# Patient Record
Sex: Female | Born: 1980 | Race: Black or African American | Hispanic: No | Marital: Married | Smoking: Never smoker
Health system: Southern US, Community
[De-identification: ages and names within clinical notes are randomized; demographics above are authoritative.]

## PROBLEM LIST (undated history)

## (undated) ENCOUNTER — Inpatient Hospital Stay (HOSPITAL_COMMUNITY): Payer: Self-pay

## (undated) DIAGNOSIS — D509 Iron deficiency anemia, unspecified: Principal | ICD-10-CM

## (undated) DIAGNOSIS — T7840XA Allergy, unspecified, initial encounter: Secondary | ICD-10-CM

## (undated) DIAGNOSIS — M722 Plantar fascial fibromatosis: Secondary | ICD-10-CM

## (undated) DIAGNOSIS — D649 Anemia, unspecified: Secondary | ICD-10-CM

## (undated) DIAGNOSIS — O99012 Anemia complicating pregnancy, second trimester: Secondary | ICD-10-CM

## (undated) HISTORY — DX: Plantar fascial fibromatosis: M72.2

## (undated) HISTORY — DX: Anemia, unspecified: D64.9

## (undated) HISTORY — PX: MOUTH SURGERY: SHX715

## (undated) HISTORY — DX: Iron deficiency anemia, unspecified: D50.9

## (undated) HISTORY — DX: Anemia complicating pregnancy, second trimester: O99.012

## (undated) HISTORY — DX: Allergy, unspecified, initial encounter: T78.40XA

---

## 2004-08-22 HISTORY — PX: CYSTECTOMY: SUR359

## 2009-01-14 ENCOUNTER — Emergency Department (HOSPITAL_COMMUNITY): Admission: EM | Admit: 2009-01-14 | Discharge: 2009-01-14 | Payer: Self-pay | Admitting: Emergency Medicine

## 2011-09-07 ENCOUNTER — Other Ambulatory Visit: Payer: Self-pay | Admitting: Family Medicine

## 2011-09-07 DIAGNOSIS — N63 Unspecified lump in unspecified breast: Secondary | ICD-10-CM

## 2011-09-16 ENCOUNTER — Ambulatory Visit
Admission: RE | Admit: 2011-09-16 | Discharge: 2011-09-16 | Disposition: A | Discharge status: Home / Self Care | Payer: Managed Care, Other (non HMO) | Source: Ambulatory Visit | Attending: Family Medicine | Admitting: Family Medicine

## 2011-09-16 DIAGNOSIS — N63 Unspecified lump in unspecified breast: Secondary | ICD-10-CM

## 2012-02-01 ENCOUNTER — Other Ambulatory Visit: Payer: Self-pay | Admitting: Obstetrics

## 2012-08-22 NOTE — L&D Delivery Note (Signed)
Delivery Note At 9:36 AM a viable female was delivered via  (Presentation: LOA ;  ).  APGAR: 8-9, ; weight: 2935gms .   Placenta status: Spontaneous, intact , .  Cord: 3 vessel,  with the following complications: None.  Cord pH: none  Anesthesia:  Epidural Episiotomy: None Lacerations: None Suture Repair: None Est. Blood Loss (mL): 350  Mom to postpartum.  Baby to nursery-stable.  [name],[name] 03/07/2013, 9:59 AM

## 2012-09-04 LAB — OB RESULTS CONSOLE HGB/HCT, BLOOD
HCT: 31 %
Hemoglobin: 10 g/dL

## 2012-09-04 LAB — OB RESULTS CONSOLE GC/CHLAMYDIA
Chlamydia: NEGATIVE
Gonorrhea: NEGATIVE

## 2012-09-04 LAB — SICKLE CELL SCREEN: Sickle Cell Screen: POSITIVE

## 2012-09-04 LAB — OB RESULTS CONSOLE PLATELET COUNT: Platelets: 345 10*3/uL

## 2012-09-04 LAB — OB RESULTS CONSOLE ANTIBODY SCREEN: Antibody Screen: NEGATIVE

## 2012-10-30 LAB — US OB DETAIL + 14 WK

## 2012-11-09 ENCOUNTER — Encounter: Payer: Self-pay | Admitting: Obstetrics

## 2012-11-23 ENCOUNTER — Encounter: Payer: Self-pay | Admitting: *Deleted

## 2012-11-29 ENCOUNTER — Encounter: Payer: Self-pay | Admitting: Obstetrics

## 2012-12-11 ENCOUNTER — Encounter: Payer: Self-pay | Admitting: Obstetrics

## 2012-12-11 ENCOUNTER — Ambulatory Visit (INDEPENDENT_AMBULATORY_CARE_PROVIDER_SITE_OTHER): Payer: 59 | Admitting: Obstetrics

## 2012-12-11 VITALS — BP 107/70 | Temp 97.5°F | Wt 132.8 lb

## 2012-12-11 DIAGNOSIS — Z3480 Encounter for supervision of other normal pregnancy, unspecified trimester: Secondary | ICD-10-CM

## 2012-12-11 DIAGNOSIS — K219 Gastro-esophageal reflux disease without esophagitis: Secondary | ICD-10-CM

## 2012-12-11 DIAGNOSIS — O219 Vomiting of pregnancy, unspecified: Secondary | ICD-10-CM | POA: Insufficient documentation

## 2012-12-11 DIAGNOSIS — Z3482 Encounter for supervision of other normal pregnancy, second trimester: Secondary | ICD-10-CM

## 2012-12-11 LAB — POCT URINALYSIS DIPSTICK
Blood, UA: NEGATIVE
Nitrite, UA: NEGATIVE
Urobilinogen, UA: NEGATIVE
pH, UA: 5

## 2012-12-11 NOTE — Progress Notes (Signed)
Patient states she stopped prenatal vitals due to nausea, no appetite,constipation, and increase in heartburn not relieved by omeprazole. Patient states she started lactaid, and increase in green leafy vegetables to help with vitamin d level.

## 2012-12-20 ENCOUNTER — Other Ambulatory Visit: Payer: 59 | Admitting: *Deleted

## 2012-12-20 ENCOUNTER — Ambulatory Visit (INDEPENDENT_AMBULATORY_CARE_PROVIDER_SITE_OTHER): Payer: 59 | Admitting: Obstetrics

## 2012-12-20 ENCOUNTER — Encounter: Payer: Self-pay | Admitting: Obstetrics

## 2012-12-20 VITALS — BP 105/65 | Temp 97.2°F | Ht 69.0 in | Wt 133.0 lb

## 2012-12-20 DIAGNOSIS — Z3480 Encounter for supervision of other normal pregnancy, unspecified trimester: Secondary | ICD-10-CM

## 2012-12-20 DIAGNOSIS — Z3482 Encounter for supervision of other normal pregnancy, second trimester: Secondary | ICD-10-CM

## 2012-12-20 DIAGNOSIS — Z3483 Encounter for supervision of other normal pregnancy, third trimester: Secondary | ICD-10-CM

## 2012-12-20 LAB — POCT URINALYSIS DIPSTICK
Blood, UA: NEGATIVE
Protein, UA: NEGATIVE
Spec Grav, UA: <=1.005

## 2012-12-20 LAB — CBC
Hemoglobin: 7.8 g/dL — ABNORMAL LOW (ref 12.0–15.0)
MCH: 20.4 pg — ABNORMAL LOW (ref 26.0–34.0)
RBC: 3.82 MIL/uL — ABNORMAL LOW (ref 3.87–5.11)
WBC: 10.2 10*3/uL (ref 4.0–10.5)

## 2012-12-20 NOTE — Progress Notes (Signed)
Pulse-105 Pt c/o contractions and lower back pains worse with movement x 1 day intermittent with pink spotting in panties.

## 2012-12-20 NOTE — Addendum Note (Signed)
Addended by: Julaine Hua on: 12/20/2012 05:33 PM   Modules accepted: Orders

## 2012-12-21 LAB — HIV ANTIBODY (ROUTINE TESTING W REFLEX): HIV: NONREACTIVE

## 2012-12-21 LAB — GLUCOSE TOLERANCE, 2 HOURS W/ 1HR: Glucose, 1 hour: 116 mg/dL (ref 70–170)

## 2012-12-25 ENCOUNTER — Other Ambulatory Visit: Payer: Self-pay | Admitting: *Deleted

## 2012-12-25 DIAGNOSIS — D649 Anemia, unspecified: Secondary | ICD-10-CM

## 2012-12-25 MED ORDER — FUSION PLUS PO CAPS: 1.0000 | ORAL_CAPSULE | Freq: Every day | ORAL | Status: DC

## 2013-01-03 ENCOUNTER — Ambulatory Visit (INDEPENDENT_AMBULATORY_CARE_PROVIDER_SITE_OTHER): Payer: 59 | Admitting: Obstetrics & Gynecology

## 2013-01-03 ENCOUNTER — Encounter: Payer: Self-pay | Admitting: Obstetrics & Gynecology

## 2013-01-03 VITALS — BP 104/70 | Temp 97.1°F | Wt 135.0 lb

## 2013-01-03 DIAGNOSIS — Z3483 Encounter for supervision of other normal pregnancy, third trimester: Secondary | ICD-10-CM

## 2013-01-03 DIAGNOSIS — D649 Anemia, unspecified: Secondary | ICD-10-CM

## 2013-01-03 DIAGNOSIS — Z23 Encounter for immunization: Secondary | ICD-10-CM

## 2013-01-03 DIAGNOSIS — Z3480 Encounter for supervision of other normal pregnancy, unspecified trimester: Secondary | ICD-10-CM

## 2013-01-03 LAB — POCT URINALYSIS DIPSTICK
Glucose, UA: NEGATIVE
Leukocytes, UA: NEGATIVE
Nitrite, UA: NEGATIVE
Spec Grav, UA: 1.015
Urobilinogen, UA: NEGATIVE

## 2013-01-03 MED ORDER — FERRALET 90 90-1 MG PO TABS: 1.0000 | ORAL_TABLET | Freq: Every day | ORAL | Status: DC

## 2013-01-03 NOTE — Progress Notes (Signed)
Pulse-94 Pt reports iron supplement is causing nausea and heartburn. Pt reports has d/c x 2 days and symptoms are less.

## 2013-01-03 NOTE — Patient Instructions (Signed)
Laparoscopic Tubal Ligation Laparoscopic tubal ligation is a procedure that closes the fallopian tubes at a time other than right after childbirth. By closing the fallopian tubes, the eggs that are released from the ovaries cannot enter the uterus and sperm cannot reach the egg. Tubal ligation is also known as getting your "tubes tied." Tubal ligation is done so you will not be able to get pregnant or have a baby.  Although this procedure may be reversed, it should be considered permanent and irreversible. If you want to have future pregnancies, you should not have this procedure.  LET YOUR CAREGIVER KNOW ABOUT:  Allergies to food or medicine.  Medicines taken, including vitamins, herbs, eyedrops, over-the-counter medicines, and creams.  Use of steroids (by mouth or creams).  Previous problems with numbing medicines.  History of bleeding problems or blood clots.  Any recent colds or infections.  Previous surgery.  Other health problems, including diabetes and kidney problems.  Possibility of pregnancy, if this applies.  Any past pregnancies. RISKS AND COMPLICATIONS   Infection.  Bleeding.  Injury to surrounding organs.  Anesthetic side effects.  Failure of the procedure.  Ectopic pregnancy.  Future regret about having the procedure done. BEFORE THE PROCEDURE  Do not take aspirin or blood thinners a week before the procedure or as directed. This can cause bleeding.  Do not eat or drink anything 6 to 8 hours before the procedure. PROCEDURE   You may be given a medicine to help you relax (sedative) before the procedure. You will be given a medicine to make you sleep (general anesthetic) during the procedure.  A tube will be put down your throat to help your breath while under general anesthesia.  Two small cuts (incisions) are made in the lower abdominal area and near the belly button.  Your abdominal area will be inflated with a safe gas (carbon dioxide). This helps  give the surgeon room to operate, visualize, and helps the surgeon avoid other organs.  A thin, lighted tube (laparoscope) with a camera attached is inserted into your abdomen through one of the incisions near the belly button. Other small instruments are also inserted through the other abdominal incision.  The fallopian tubes are located and are either blocked with a ring, clip, or are burned (cauterized).  After the fallopian tubes are blocked, the gas is released from the abdomen.  The incisions will be closed with stitches (sutures), and a bandage may be placed over the incisions. AFTER THE PROCEDURE   You will rest in a recovery room for 1 4 hours until you are stable and doing well.  You will also have some mild abdominal discomfort for 3 7 days. You will be given pain medicine to ease any discomfort.  As long as there are no problems, you may be allowed to go home. Someone will need to drive you home and be with you for at least 24 hours once home.  You may have some mild discomfort in the throat. This is from the tube placed in your throat while you were sleeping.  You may experience discomfort in the shoulder area from some trapped air between the liver and diaphragm. This sensation is normal and will slowly go away on its own. Document Released: 11/14/2000 Document Revised: 02/07/2012 Document Reviewed: 11/19/2011 [employer] Patient Information 2013 [employer].  

## 2013-01-03 NOTE — Progress Notes (Signed)
Offered alternative Fe supplement.

## 2013-01-16 ENCOUNTER — Ambulatory Visit (INDEPENDENT_AMBULATORY_CARE_PROVIDER_SITE_OTHER): Payer: 59 | Admitting: Obstetrics

## 2013-01-16 ENCOUNTER — Encounter: Payer: Self-pay | Admitting: Obstetrics

## 2013-01-16 VITALS — BP 103/67 | Temp 97.7°F | Wt 134.6 lb

## 2013-01-16 DIAGNOSIS — Z3483 Encounter for supervision of other normal pregnancy, third trimester: Secondary | ICD-10-CM

## 2013-01-16 DIAGNOSIS — Z3480 Encounter for supervision of other normal pregnancy, unspecified trimester: Secondary | ICD-10-CM

## 2013-01-16 LAB — POCT URINALYSIS DIPSTICK
Ketones, UA: NEGATIVE
Leukocytes, UA: NEGATIVE
Nitrite, UA: NEGATIVE
Urobilinogen, UA: 4
pH, UA: 5

## 2013-01-16 NOTE — Progress Notes (Signed)
Pulse- 94 

## 2013-01-31 ENCOUNTER — Ambulatory Visit (INDEPENDENT_AMBULATORY_CARE_PROVIDER_SITE_OTHER): Payer: 59 | Admitting: Obstetrics

## 2013-01-31 VITALS — BP 104/66 | Temp 98.4°F | Wt 134.0 lb

## 2013-01-31 DIAGNOSIS — Z3480 Encounter for supervision of other normal pregnancy, unspecified trimester: Secondary | ICD-10-CM

## 2013-01-31 DIAGNOSIS — Z3483 Encounter for supervision of other normal pregnancy, third trimester: Secondary | ICD-10-CM

## 2013-01-31 LAB — POCT URINALYSIS DIPSTICK
Blood, UA: NEGATIVE
Leukocytes, UA: NEGATIVE
Nitrite, UA: NEGATIVE
Protein, UA: NEGATIVE
Urobilinogen, UA: NEGATIVE
pH, UA: 7

## 2013-01-31 NOTE — Progress Notes (Signed)
Pulse- 89 Pt states she is having pressure on her bladder and 3-4 contractions a day.

## 2013-02-14 ENCOUNTER — Encounter: Payer: 59 | Admitting: Obstetrics

## 2013-02-15 ENCOUNTER — Ambulatory Visit (INDEPENDENT_AMBULATORY_CARE_PROVIDER_SITE_OTHER): Payer: 59 | Admitting: Obstetrics

## 2013-02-15 VITALS — BP 105/69 | Temp 98.4°F | Wt 132.2 lb

## 2013-02-15 DIAGNOSIS — Z3480 Encounter for supervision of other normal pregnancy, unspecified trimester: Secondary | ICD-10-CM

## 2013-02-15 DIAGNOSIS — Z3483 Encounter for supervision of other normal pregnancy, third trimester: Secondary | ICD-10-CM

## 2013-02-15 LAB — POCT URINALYSIS DIPSTICK
Blood, UA: NEGATIVE
Nitrite, UA: NEGATIVE
Spec Grav, UA: 1.015
Urobilinogen, UA: NEGATIVE
pH, UA: 7

## 2013-02-15 NOTE — Addendum Note (Signed)
Addended by: Julaine Hua on: 02/15/2013 12:41 PM   Modules accepted: Orders

## 2013-02-15 NOTE — Progress Notes (Signed)
Pulse: 97

## 2013-02-25 ENCOUNTER — Encounter: Payer: Self-pay | Admitting: Obstetrics

## 2013-02-25 ENCOUNTER — Ambulatory Visit (INDEPENDENT_AMBULATORY_CARE_PROVIDER_SITE_OTHER): Payer: 59 | Admitting: Obstetrics

## 2013-02-25 VITALS — BP 118/71 | Temp 97.7°F | Wt 135.0 lb

## 2013-02-25 DIAGNOSIS — Z3483 Encounter for supervision of other normal pregnancy, third trimester: Secondary | ICD-10-CM

## 2013-02-25 DIAGNOSIS — Z3480 Encounter for supervision of other normal pregnancy, unspecified trimester: Secondary | ICD-10-CM

## 2013-02-25 LAB — POCT URINALYSIS DIPSTICK
Bilirubin, UA: NEGATIVE
Ketones, UA: NEGATIVE
Leukocytes, UA: NEGATIVE
Protein, UA: NEGATIVE
Spec Grav, UA: 1.025
pH, UA: 5

## 2013-02-25 NOTE — Progress Notes (Signed)
Pulse-97 Pt c/o increased vaginal discharge and hearing a "plop" sound when voiding this morning but did not see anything in the toilet.

## 2013-03-05 ENCOUNTER — Ambulatory Visit (INDEPENDENT_AMBULATORY_CARE_PROVIDER_SITE_OTHER): Payer: 59 | Admitting: Obstetrics

## 2013-03-05 ENCOUNTER — Encounter: Payer: Self-pay | Admitting: Obstetrics

## 2013-03-05 VITALS — BP 115/67 | Temp 97.2°F | Wt 138.0 lb

## 2013-03-05 DIAGNOSIS — Z3480 Encounter for supervision of other normal pregnancy, unspecified trimester: Secondary | ICD-10-CM

## 2013-03-05 DIAGNOSIS — Z3483 Encounter for supervision of other normal pregnancy, third trimester: Secondary | ICD-10-CM

## 2013-03-05 LAB — POCT URINALYSIS DIPSTICK
Bilirubin, UA: NEGATIVE
Glucose, UA: NEGATIVE
Ketones, UA: NEGATIVE
Leukocytes, UA: NEGATIVE
Nitrite, UA: NEGATIVE
pH, UA: 5

## 2013-03-05 NOTE — Progress Notes (Signed)
Pulse-94 Pt c/o "bubbling" feeling in lower abdomen x 2 to 3 days.

## 2013-03-07 ENCOUNTER — Inpatient Hospital Stay (HOSPITAL_COMMUNITY): Payer: 59 | Admitting: Anesthesiology

## 2013-03-07 ENCOUNTER — Encounter (HOSPITAL_COMMUNITY): Payer: Self-pay

## 2013-03-07 ENCOUNTER — Inpatient Hospital Stay (HOSPITAL_COMMUNITY)
Admission: AD | Admit: 2013-03-07 | Discharge: 2013-03-07 | Disposition: A | Discharge status: Home / Self Care | Payer: 59 | Source: Ambulatory Visit | Attending: Obstetrics & Gynecology | Admitting: Obstetrics & Gynecology

## 2013-03-07 ENCOUNTER — Encounter (HOSPITAL_COMMUNITY): Payer: Self-pay | Admitting: Anesthesiology

## 2013-03-07 ENCOUNTER — Inpatient Hospital Stay (HOSPITAL_COMMUNITY)
Admission: AD | Admit: 2013-03-07 | Discharge: 2013-03-09 | DRG: 775 | Disposition: A | Discharge status: Home / Self Care | Payer: 59 | Source: Ambulatory Visit | Attending: Obstetrics | Admitting: Obstetrics

## 2013-03-07 ENCOUNTER — Encounter (HOSPITAL_COMMUNITY): Payer: Self-pay | Admitting: *Deleted

## 2013-03-07 DIAGNOSIS — O9902 Anemia complicating childbirth: Secondary | ICD-10-CM | POA: Diagnosis present

## 2013-03-07 DIAGNOSIS — O479 False labor, unspecified: Secondary | ICD-10-CM | POA: Insufficient documentation

## 2013-03-07 DIAGNOSIS — D649 Anemia, unspecified: Secondary | ICD-10-CM | POA: Diagnosis present

## 2013-03-07 DIAGNOSIS — O99892 Other specified diseases and conditions complicating childbirth: Principal | ICD-10-CM | POA: Diagnosis present

## 2013-03-07 DIAGNOSIS — Z22330 Carrier of Group B streptococcus: Secondary | ICD-10-CM

## 2013-03-07 LAB — CBC
Hemoglobin: 9 g/dL — ABNORMAL LOW (ref 12.0–15.0)
MCH: 19.4 pg — ABNORMAL LOW (ref 26.0–34.0)
MCV: 64 fL — ABNORMAL LOW (ref 78.0–100.0)
RBC: 4.64 MIL/uL (ref 3.87–5.11)

## 2013-03-07 LAB — RPR: RPR Ser Ql: NONREACTIVE

## 2013-03-07 MED ORDER — FENTANYL 2.5 MCG/ML BUPIVACAINE 1/10 % EPIDURAL INFUSION (WH - ANES)
14.0000 mL/h | INTRAMUSCULAR | Status: DC | PRN
  Administered 2013-03-07: 14 mL/h via EPIDURAL
  Filled 2013-03-07: qty 125

## 2013-03-07 MED ORDER — LANOLIN HYDROUS EX OINT: TOPICAL_OINTMENT | CUTANEOUS | Status: DC | PRN

## 2013-03-07 MED ORDER — NALBUPHINE SYRINGE 5 MG/0.5 ML
10.0000 mg | INJECTION | INTRAMUSCULAR | Status: DC | PRN
  Filled 2013-03-07: qty 1

## 2013-03-07 MED ORDER — ACETAMINOPHEN 325 MG PO TABS: 650.0000 mg | ORAL_TABLET | ORAL | Status: DC | PRN

## 2013-03-07 MED ORDER — OXYCODONE-ACETAMINOPHEN 5-325 MG PO TABS: 1.0000 | ORAL_TABLET | ORAL | Status: DC | PRN

## 2013-03-07 MED ORDER — LACTATED RINGERS IV SOLN
500.0000 mL | Freq: Once | INTRAVENOUS | Status: AC
  Administered 2013-03-07: 500 mL via INTRAVENOUS

## 2013-03-07 MED ORDER — OXYTOCIN 40 UNITS IN LACTATED RINGERS INFUSION - SIMPLE MED: 62.5000 mL/h | INTRAVENOUS | Status: DC | PRN

## 2013-03-07 MED ORDER — LACTATED RINGERS IV SOLN: INTRAVENOUS | Status: DC

## 2013-03-07 MED ORDER — CITRIC ACID-SODIUM CITRATE 334-500 MG/5ML PO SOLN: 30.0000 mL | ORAL | Status: DC | PRN

## 2013-03-07 MED ORDER — DIPHENHYDRAMINE HCL 50 MG/ML IJ SOLN: 12.5000 mg | INTRAMUSCULAR | Status: DC | PRN

## 2013-03-07 MED ORDER — SENNOSIDES-DOCUSATE SODIUM 8.6-50 MG PO TABS
2.0000 | ORAL_TABLET | Freq: Every day | ORAL | Status: DC
  Administered 2013-03-08: 2 via ORAL

## 2013-03-07 MED ORDER — PHENYLEPHRINE 40 MCG/ML (10ML) SYRINGE FOR IV PUSH (FOR BLOOD PRESSURE SUPPORT)
80.0000 ug | PREFILLED_SYRINGE | INTRAVENOUS | Status: DC | PRN
  Filled 2013-03-07: qty 2
  Filled 2013-03-07: qty 5

## 2013-03-07 MED ORDER — EPHEDRINE 5 MG/ML INJ
10.0000 mg | INTRAVENOUS | Status: DC | PRN
  Filled 2013-03-07: qty 4
  Filled 2013-03-07: qty 2

## 2013-03-07 MED ORDER — PRENATAL MULTIVITAMIN CH
1.0000 | ORAL_TABLET | Freq: Every day | ORAL | Status: DC
  Administered 2013-03-07 – 2013-03-08 (×2): 1 via ORAL
  Filled 2013-03-07 (×2): qty 1

## 2013-03-07 MED ORDER — OXYTOCIN BOLUS FROM INFUSION
500.0000 mL | INTRAVENOUS | Status: DC
  Administered 2013-03-07: 500 mL via INTRAVENOUS

## 2013-03-07 MED ORDER — OXYCODONE-ACETAMINOPHEN 5-325 MG PO TABS
1.0000 | ORAL_TABLET | ORAL | Status: DC | PRN
  Administered 2013-03-07 – 2013-03-09 (×7): 1 via ORAL
  Filled 2013-03-07 (×7): qty 1

## 2013-03-07 MED ORDER — IBUPROFEN 600 MG PO TABS: 600.0000 mg | ORAL_TABLET | Freq: Four times a day (QID) | ORAL | Status: DC | PRN

## 2013-03-07 MED ORDER — SIMETHICONE 80 MG PO CHEW: 80.0000 mg | CHEWABLE_TABLET | ORAL | Status: DC | PRN

## 2013-03-07 MED ORDER — DIBUCAINE 1 % RE OINT: 1.0000 "application " | TOPICAL_OINTMENT | RECTAL | Status: DC | PRN

## 2013-03-07 MED ORDER — ONDANSETRON HCL 4 MG/2ML IJ SOLN: 4.0000 mg | Freq: Four times a day (QID) | INTRAMUSCULAR | Status: DC | PRN

## 2013-03-07 MED ORDER — ONDANSETRON HCL 4 MG PO TABS: 4.0000 mg | ORAL_TABLET | ORAL | Status: DC | PRN

## 2013-03-07 MED ORDER — ZOLPIDEM TARTRATE 5 MG PO TABS: 5.0000 mg | ORAL_TABLET | Freq: Every evening | ORAL | Status: DC | PRN

## 2013-03-07 MED ORDER — TETANUS-DIPHTH-ACELL PERTUSSIS 5-2.5-18.5 LF-MCG/0.5 IM SUSP: 0.5000 mL | Freq: Once | INTRAMUSCULAR | Status: DC

## 2013-03-07 MED ORDER — BENZOCAINE-MENTHOL 20-0.5 % EX AERO
1.0000 "application " | INHALATION_SPRAY | CUTANEOUS | Status: DC | PRN
  Administered 2013-03-07: 1 via TOPICAL
  Filled 2013-03-07: qty 56

## 2013-03-07 MED ORDER — LACTATED RINGERS IV SOLN: 500.0000 mL | INTRAVENOUS | Status: DC | PRN

## 2013-03-07 MED ORDER — NALBUPHINE HCL 10 MG/ML IJ SOLN
10.0000 mg | Freq: Four times a day (QID) | INTRAMUSCULAR | Status: DC | PRN
  Filled 2013-03-07: qty 1

## 2013-03-07 MED ORDER — SODIUM CHLORIDE 0.9 % IV SOLN
2.0000 g | Freq: Four times a day (QID) | INTRAVENOUS | Status: DC
  Administered 2013-03-07: 2 g via INTRAVENOUS
  Filled 2013-03-07 (×2): qty 2000

## 2013-03-07 MED ORDER — WITCH HAZEL-GLYCERIN EX PADS: 1.0000 "application " | MEDICATED_PAD | CUTANEOUS | Status: DC | PRN

## 2013-03-07 MED ORDER — EPHEDRINE 5 MG/ML INJ
10.0000 mg | INTRAVENOUS | Status: DC | PRN
  Filled 2013-03-07: qty 2

## 2013-03-07 MED ORDER — ONDANSETRON HCL 4 MG/2ML IJ SOLN: 4.0000 mg | INTRAMUSCULAR | Status: DC | PRN

## 2013-03-07 MED ORDER — PHENYLEPHRINE 40 MCG/ML (10ML) SYRINGE FOR IV PUSH (FOR BLOOD PRESSURE SUPPORT)
80.0000 ug | PREFILLED_SYRINGE | INTRAVENOUS | Status: DC | PRN
  Filled 2013-03-07: qty 2

## 2013-03-07 MED ORDER — PROMETHAZINE HCL 25 MG/ML IJ SOLN
25.0000 mg | Freq: Four times a day (QID) | INTRAMUSCULAR | Status: DC | PRN
Start: 2013-03-07 — End: 2013-03-07

## 2013-03-07 MED ORDER — LIDOCAINE HCL (PF) 1 % IJ SOLN
INTRAMUSCULAR | Status: DC | PRN
  Administered 2013-03-07 (×2): 5 mL

## 2013-03-07 MED ORDER — DIPHENHYDRAMINE HCL 25 MG PO CAPS: 25.0000 mg | ORAL_CAPSULE | Freq: Four times a day (QID) | ORAL | Status: DC | PRN

## 2013-03-07 MED ORDER — LIDOCAINE HCL (PF) 1 % IJ SOLN
30.0000 mL | INTRAMUSCULAR | Status: DC | PRN
  Filled 2013-03-07: qty 30

## 2013-03-07 MED ORDER — OXYTOCIN 40 UNITS IN LACTATED RINGERS INFUSION - SIMPLE MED
62.5000 mL/h | INTRAVENOUS | Status: DC
  Filled 2013-03-07: qty 1000

## 2013-03-07 NOTE — Progress Notes (Signed)
Kristin Payne is a 32 y.o. [id number] at [redacted]w[redacted]d by LMP admitted for active labor.  Subjective:   Objective: BP 133/63  Pulse 94  Temp(Src) 97.7 F (36.5 C) (Oral)  Resp 20  LMP 06/14/2012      FHT:  FHR: 150 bpm, variability: moderate,  accelerations:  Present,  decelerations:  Absent UC:   regular, every 3 minutes SVE:   Dilation: 10 Effacement (%): 100 Station: +1;+2 Exam by:: Kristin Payne, RNC  Labs: Lab Results  Component Value Date   WBC 13.0* 03/07/2013   HGB 9.0* 03/07/2013   HCT 29.7* 03/07/2013   MCV 64.0* 03/07/2013   PLT 198 03/07/2013    Assessment / Plan: Spontaneous labor, progressing normally  Labor: Progressing normally Preeclampsia:  n/a Fetal Wellbeing:  Category I Pain Control:  Epidural I/D:  n/a Anticipated MOD:  NSVD  Kristin Payne A 03/07/2013, 9:55 AM

## 2013-03-07 NOTE — MAU Note (Signed)
Contractions tonight. Denies leaking of fluid or vaginal bleeding. Positive fetal movement.  

## 2013-03-07 NOTE — MAU Note (Signed)
Was seen in MAU last night. UC's stronger

## 2013-03-07 NOTE — Anesthesia Preprocedure Evaluation (Signed)
Anesthesia Evaluation  Patient identified by MRN, date of birth, ID band Patient awake    Reviewed: Allergy & Precautions, H&P , Patient's Chart, lab work & pertinent test results  Airway Mallampati: II TM Distance: >3 FB Neck ROM: full    Dental no notable dental hx.    Pulmonary neg pulmonary ROS,  breath sounds clear to auscultation  Pulmonary exam normal       Cardiovascular negative cardio ROS  Rhythm:regular Rate:Normal     Neuro/Psych negative neurological ROS  negative psych ROS   GI/Hepatic negative GI ROS, Neg liver ROS, GERD-  ,  Endo/Other  negative endocrine ROS  Renal/GU negative Renal ROS     Musculoskeletal   Abdominal   Peds  Hematology negative hematology ROS (+) anemia ,   Anesthesia Other Findings   Reproductive/Obstetrics (+) Pregnancy                           Anesthesia Physical Anesthesia Plan  ASA: II  Anesthesia Plan: Epidural   Post-op Pain Management:    Induction:   Airway Management Planned:   Additional Equipment:   Intra-op Plan:   Post-operative Plan:   Informed Consent: I have reviewed the patients History and Physical, chart, labs and discussed the procedure including the risks, benefits and alternatives for the proposed anesthesia with the patient or authorized representative who has indicated his/her understanding and acceptance.     Plan Discussed with:   Anesthesia Plan Comments:         Anesthesia Quick Evaluation

## 2013-03-07 NOTE — Anesthesia Procedure Notes (Signed)
Epidural Patient location during procedure: OB Start time: 03/07/2013 8:57 AM  Staffing Anesthesiologist: Angus Seller., Harrell Gave. Performed by: anesthesiologist   Preanesthetic Checklist Completed: patient identified, site marked, surgical consent, pre-op evaluation, timeout performed, IV checked, risks and benefits discussed and monitors and equipment checked  Epidural Patient position: sitting Prep: site prepped and draped and DuraPrep Patient monitoring: continuous pulse ox and blood pressure Approach: midline Injection technique: LOR air and LOR saline  Needle:  Needle type: Tuohy  Needle gauge: 17 G Needle length: 9 cm and 9 Needle insertion depth: 5 cm cm Catheter type: closed end flexible Catheter size: 19 Gauge Catheter at skin depth: 10 cm Test dose: negative  Assessment Events: blood not aspirated, injection not painful, no injection resistance, negative IV test and no paresthesia  Additional Notes Patient identified.  Risk benefits discussed including failed block, incomplete pain control, headache, nerve damage, paralysis, blood pressure changes, nausea, vomiting, reactions to medication both toxic or allergic, and postpartum back pain.  Patient expressed understanding and wished to proceed.  All questions were answered.  Sterile technique used throughout procedure and epidural site dressed with sterile barrier dressing. No paresthesia or other complications noted.The patient did not experience any signs of intravascular injection such as tinnitus or metallic taste in mouth nor signs of intrathecal spread such as rapid motor block. Please see nursing notes for vital signs.

## 2013-03-07 NOTE — Anesthesia Postprocedure Evaluation (Signed)
  Anesthesia Post-op Note  Anesthesia Post Note  Patient: Kristin Payne  Procedure(s) Performed: * No procedures listed *  Anesthesia type: Epidural  Patient location: Mother/Baby  Post pain: Pain level controlled  Post assessment: Post-op Vital signs reviewed  Last Vitals:  Filed Vitals:   03/07/13 1300  BP: 108/58  Pulse: 90  Temp: 36.8 C  Resp: 18    Post vital signs: Reviewed  Level of consciousness:alert  Complications: No apparent anesthesia complications

## 2013-03-07 NOTE — H&P (Signed)
Kristin Payne is a 32 y.o. female presenting for UC's. Maternal Medical History:  Reason for admission: Contractions.  32 y o [place].  EDC 03-21-13.  Presents with UC's.   Contractions: Onset was 6-12 hours ago.   Frequency: regular.   Perceived severity is strong.    Fetal activity: Perceived fetal activity is normal.   Last perceived fetal movement was within the past hour.    Prenatal complications: no prenatal complications Prenatal Complications - Diabetes: none.    OB History   Grav Para Term Preterm Abortions TAB SAB Ect Mult Living   4 3 2 1      3      Past Medical History  Diagnosis Date  . Anemia    Past Surgical History  Procedure Laterality Date  . Cystectomy  2006    left   Family History: family history is not on file. Social History:  reports that she has never smoked. She does not have any smokeless tobacco history on file. She reports that she does not drink alcohol or use illicit drugs.   Prenatal Transfer Tool  Maternal Diabetes: No Genetic Screening: Normal Maternal Ultrasounds/Referrals: Normal Fetal Ultrasounds or other Referrals:  None Maternal Substance Abuse:  No Significant Maternal Medications:  None Significant Maternal Lab Results:  None Other Comments:  None  Review of Systems  All other systems reviewed and are negative.    Dilation: 6 Effacement (%): 100 Station: -1 Exam by:: S. Carrera, RNC Blood pressure 90/52, pulse 97, temperature 98.2 F (36.8 C), temperature source Oral, resp. rate 20, last menstrual period 06/14/2012. Maternal Exam:  Uterine Assessment: Contraction strength is moderate.  Abdomen: Patient reports no abdominal tenderness. Fetal presentation: vertex  Introitus: Normal vulva. Normal vagina.    Physical Exam  Nursing note and vitals reviewed. Constitutional: She is oriented to person, place, and time. She appears well-developed and well-nourished.  HENT:  Head: Normocephalic and atraumatic.  Eyes:  Conjunctivae are normal. Pupils are equal, round, and reactive to light.  Neck: Normal range of motion. Neck supple.  Cardiovascular: Normal rate and regular rhythm.   Respiratory: Effort normal and breath sounds normal.  GI: Soft.  Genitourinary: Vagina normal and uterus normal.  Musculoskeletal: Normal range of motion.  Neurological: She is alert and oriented to person, place, and time.  Skin: Skin is warm and dry.  Psychiatric: She has a normal mood and affect. Her behavior is normal. Judgment and thought content normal.    Prenatal labs: ABO, Rh: O/Positive/-- (01/14 0000) Antibody: Negative (01/14 0000) Rubella: Immune (01/14 0000) RPR: NON REAC (05/01 1739)  HBsAg: Negative (01/14 0000)  HIV: NON REACTIVE (05/01 1739)  GBS: POSITIVE (06/27 1244)   Assessment/Plan: 38 weeks.  Active labor.  Admit.  Expectant management.   Jacquita Mulhearn A 03/07/2013, 8:01 AM

## 2013-03-08 LAB — CBC
MCHC: 30.7 g/dL (ref 30.0–36.0)
MCV: 64 fL — ABNORMAL LOW (ref 78.0–100.0)
Platelets: 186 10*3/uL (ref 150–400)
RDW: 20.1 % — ABNORMAL HIGH (ref 11.5–15.5)
WBC: 15.5 10*3/uL — ABNORMAL HIGH (ref 4.0–10.5)

## 2013-03-08 NOTE — Lactation Note (Signed)
This note was copied from the chart of Kristin Payne. Lactation Consultation Note  Patient Name: Kristin Payne [id number] Date: 03/08/2013 Reason for consult: Follow-up assessment   Maternal Data Formula Feeding for Exclusion: No Does the patient have breastfeeding experience prior to this delivery?: Yes  Feeding    LATCH Score/Interventions                      Lactation Tools Discussed/Used     Consult Status Consult Status: PRN Experienced BF mom reports that baby is nursing well. Reports that left nipple is slightly tender on tip of nipple- because she was nursing so much through the night. Asking about lanolin- encouraged to use Colostrum instead. Mom reports that she did use lanolin with her first baby and she did have yeast.  Reports that she feels tingling in her breast like her milk is beginning to come in. No questions at present. To call prn   Pamelia Hoit 03/08/2013, 2:39 PM

## 2013-03-08 NOTE — Progress Notes (Signed)
Post Partum Day 1 Subjective: no complaints, voiding, tolerating PO and + flatus  Objective: Blood pressure 106/65, pulse 78, temperature 98.1 F (36.7 C), temperature source Oral, resp. rate 18, last menstrual period 06/14/2012, SpO2 100.00%, unknown if currently breastfeeding.  Physical Exam:  General: alert and no distress Lochia: appropriate Uterine Fundus: firm Incision: healing well DVT Evaluation: No evidence of DVT seen on physical exam.   Recent Labs  03/07/13 0820 03/08/13 0605  HGB 9.0* 7.7*  HCT 29.7* 25.1*    Assessment/Plan: Plan for discharge tomorrow   LOS: 1 day   [name],[name] 03/08/2013, 10:58 AM

## 2013-03-09 MED ORDER — FUSION PLUS PO CAPS: 1.0000 | ORAL_CAPSULE | Freq: Every day | ORAL | Status: DC

## 2013-03-09 MED ORDER — OXYCODONE-ACETAMINOPHEN 5-325 MG PO TABS: 1.0000 | ORAL_TABLET | ORAL | Status: DC | PRN

## 2013-03-09 NOTE — Progress Notes (Signed)
Post Partum Day 2 Subjective: no complaints, up ad lib, voiding, tolerating PO and + flatus  Objective: Blood pressure 106/69, pulse 80, temperature 98.4 F (36.9 C), temperature source Oral, resp. rate 20, last menstrual period 06/14/2012, SpO2 100.00%, unknown if currently breastfeeding.  Physical Exam:  General: alert and no distress Lochia: appropriate Uterine Fundus: firm Incision: none DVT Evaluation: No evidence of DVT seen on physical exam.   Recent Labs  03/07/13 0820 03/08/13 0605  HGB 9.0* 7.7*  HCT 29.7* 25.1*    Assessment/Plan: Discharge home.  Anemia.  Chronic.  Clinically stable.  Iron Rx.   LOS: 2 days   [name],[name] 03/09/2013, 7:41 AM

## 2013-03-09 NOTE — Discharge Summary (Signed)
Obstetric Discharge Summary Reason for Admission: onset of labor Prenatal Procedures: ultrasound Intrapartum Procedures: spontaneous vaginal delivery Postpartum Procedures: none Complications-Operative and Postpartum: none Hemoglobin  Date Value Range Status  03/08/2013 7.7* 12.0 - 15.0 g/dL Final  1/61/0960 45.4   Final     HCT  Date Value Range Status  03/08/2013 25.1* 36.0 - 46.0 % Final  09/04/2012 31   Final    Physical Exam:  General: alert and no distress Lochia: appropriate Uterine Fundus: firm Incision: none DVT Evaluation: No evidence of DVT seen on physical exam.  Discharge Diagnoses: Term Pregnancy-delivered  Discharge Information: Date: 03/09/2013 Activity: pelvic rest Diet: routine Medications: PNV, Colace, Iron and Percocet Condition: stable Instructions: refer to practice specific booklet Discharge to: home Follow-up Information   Follow up with Yoni Lobos A, MD. Schedule an appointment as soon as possible for a visit today.   Contact information:   [address] [place] [address] [phone]       Newborn Data: Live born female  Birth Weight: 6 lb 7.5 oz (2935 g) APGAR: 8, 9  Home with mother.  Kristin Payne A 03/09/2013, 7:50 AM

## 2013-03-14 ENCOUNTER — Encounter: Payer: 59 | Admitting: Obstetrics

## 2013-03-25 ENCOUNTER — Encounter: Payer: Self-pay | Admitting: Obstetrics

## 2013-03-25 ENCOUNTER — Ambulatory Visit (INDEPENDENT_AMBULATORY_CARE_PROVIDER_SITE_OTHER): Payer: 59 | Admitting: Obstetrics

## 2013-03-25 VITALS — BP 109/72 | HR 86 | Temp 98.6°F | Wt 126.0 lb

## 2013-03-25 DIAGNOSIS — B379 Candidiasis, unspecified: Secondary | ICD-10-CM | POA: Insufficient documentation

## 2013-03-25 MED ORDER — NYSTATIN-TRIAMCINOLONE 100000-0.1 UNIT/GM-% EX OINT: TOPICAL_OINTMENT | Freq: Two times a day (BID) | CUTANEOUS | Status: DC

## 2013-03-25 MED ORDER — FLUCONAZOLE 150 MG PO TABS: 150.0000 mg | ORAL_TABLET | Freq: Once | ORAL | Status: DC

## 2013-03-25 NOTE — Progress Notes (Signed)
Subjective:     Kristin Payne is a 32 y.o. female who presents for a postpartum visit. She is 2 weeks postpartum following a spontaneous vaginal delivery. I have fully reviewed the prenatal and intrapartum course. The delivery was at 38 gestational weeks. Outcome: spontaneous vaginal delivery. Anesthesia: epidural. Postpartum course has been WNL. Baby's course has been WNL. Baby is feeding by breast. Bleeding staining only. Bowel function is constipation. Bladder function is normal. Patient is not sexually active. Contraception method is abstinence. Postpartum depression screening: negative.  The following portions of the patient's history were reviewed and updated as appropriate: allergies, current medications, past family history, past medical history, past social history, past surgical history and problem list.  Review of Systems Pertinent items are noted in HPI.   Objective:    BP 109/72  Pulse 86  Temp(Src) 98.6 F (37 C)  Wt 126 lb (57.153 kg)  BMI 18.6 kg/m2  Breastfeeding? Yes  General:  alert and no distress   Breasts:   Cracked nipples with Candida exudate and inflammation.  Lungs: not examined  Heart:  not examined  Abdomen: normal findings: soft, non-tender   Vulva:  not evaluated  Vagina: not evaluated  Cervix:  not evaluated  Corpus: not examined  Adnexa:  not evaluated  Rectal Exam: Not performed.        Assessment:    Candidiasis of nipples . Pap smear not done at today's visit.   Plan:    1. Contraception: abstinence 2. Nystatin/Triamcinolone ointment Rx. 3. Follow up in: 4 weeks or as needed.

## 2013-04-23 ENCOUNTER — Encounter: Payer: Self-pay | Admitting: Obstetrics

## 2013-04-23 ENCOUNTER — Ambulatory Visit (INDEPENDENT_AMBULATORY_CARE_PROVIDER_SITE_OTHER): Payer: 59 | Admitting: Obstetrics

## 2013-04-23 DIAGNOSIS — B379 Candidiasis, unspecified: Secondary | ICD-10-CM

## 2013-04-23 MED ORDER — FLUCONAZOLE 150 MG PO TABS: 150.0000 mg | ORAL_TABLET | Freq: Once | ORAL | Status: DC

## 2013-04-23 MED ORDER — NYSTATIN 100000 UNIT/GM EX CREA: TOPICAL_CREAM | Freq: Two times a day (BID) | CUTANEOUS | Status: DC

## 2013-04-23 MED ORDER — MEDROXYPROGESTERONE ACETATE 150 MG/ML IM SUSP: 150.0000 mg | INTRAMUSCULAR | Status: DC

## 2013-04-23 NOTE — Progress Notes (Signed)
.   Subjective:     Kristin Payne is a 32 y.o. female who presents for a postpartum visit. She is 7 weeks postpartum following a spontaneous vaginal delivery. I have fully reviewed the prenatal and intrapartum course. The delivery was at 38 gestational weeks. Outcome: spontaneous vaginal delivery. Anesthesia: epidural. Postpartum course has been normal. Baby's course has been normal. Baby is feeding by breast. Bleeding no bleeding. Bowel function is normal. Bladder function is normal. Patient is not sexually active. Contraception method is none. Postpartum depression screening: negative.  Patient is interested in a tubal ligation.  The following portions of the patient's history were reviewed and updated as appropriate: allergies, current medications, past family history, past medical history, past social history, past surgical history and problem list.  Review of Systems Pertinent items are noted in HPI.   Objective:    BP 117/76  Pulse 81  Temp(Src) 98.7 F (37.1 C) (Oral)  Ht 5\' 9"  (1.753 m)  Wt 128 lb 12.8 oz (58.423 kg)  BMI 19.01 kg/m2  LMP 04/12/2013  Breastfeeding? Yes                                         Assessment:    Patient left before being seen by Physician.  Plan:    1. Contraception: Depo-Provera injections and tubal ligation 2. Depo Provera 3. Follow up in: 1 day or as needed.  Depo Provera

## 2013-04-25 ENCOUNTER — Encounter: Payer: Self-pay | Admitting: Obstetrics

## 2013-05-02 ENCOUNTER — Encounter: Payer: Self-pay | Admitting: Obstetrics & Gynecology

## 2013-05-02 ENCOUNTER — Ambulatory Visit (INDEPENDENT_AMBULATORY_CARE_PROVIDER_SITE_OTHER): Payer: 59 | Admitting: Obstetrics & Gynecology

## 2013-05-02 VITALS — BP 113/74 | HR 76 | Temp 98.8°F | Ht 69.0 in | Wt 130.6 lb

## 2013-05-02 DIAGNOSIS — Z3202 Encounter for pregnancy test, result negative: Secondary | ICD-10-CM

## 2013-05-02 DIAGNOSIS — Z309 Encounter for contraceptive management, unspecified: Secondary | ICD-10-CM

## 2013-05-02 DIAGNOSIS — IMO0001 Reserved for inherently not codable concepts without codable children: Secondary | ICD-10-CM

## 2013-05-02 LAB — POCT URINE PREGNANCY: Preg Test, Ur: NEGATIVE

## 2013-05-02 MED ORDER — MEDROXYPROGESTERONE ACETATE 150 MG/ML IM SUSP
150.0000 mg | INTRAMUSCULAR | Status: AC
  Administered 2013-05-02 – 2013-10-16 (×2): 150 mg via INTRAMUSCULAR

## 2013-05-02 NOTE — Patient Instructions (Signed)
Sterilization Information, Female Female sterilization is a procedure to permanently prevent pregnancy. There are different ways to perform sterilization, but all either block or close the fallopian tubes so that your eggs cannot reach your uterus. If your egg cannot reach your uterus, sperm cannot fertilize the egg, and you cannot get pregnant.  Sterilization is performed by a surgical procedure. Sometimes these procedures are performed in a hospital while a patient is asleep. Sometimes they can be done in a clinic setting with the patient awake. The fallopian tubes can be surgically cut, tied, or sealed through a procedure called tubal ligation. The fallopian tubes can also be closed with clips or rings. Sterilization can also be done by placing a tiny coil into each fallopian tube, which causes scar tissue to grow inside the tube. The scar tissue then blocks the tubes.  Discuss sterilization with your caregiver to answer any concerns you or your partner may have. You may want to ask what type of sterilization your caregiver performs. Some caregivers may not perform all the various options. Sterilization is permanent and should only be done if you are sure you do not want children or do not want any more children. Having a sterilization reversed may not be successful.  STERILIZATION PROCEDURES  Laparoscopic sterilization. This is a surgical method performed at a time other than right after childbirth. Two incisions are made in the lower abdomen. A thin, lighted tube (laparoscope) is inserted into one of the incisions and is used to perform the procedure. The fallopian tubes are closed with a ring or a clip. An instrument that uses heat could be used to seal the tubes closed (electrocautery).   Mini-laparotomy. This is a surgical method done 1 or 2 days after giving birth. Typically, a small incision is made just below the belly button (umbilicus) and the fallopian tubes are exposed. The tubes can then be  sealed, tied, or cut.   Hysteroscopic sterilization. This is performed at a time other than right after childbirth. A tiny, spring-like coil is inserted through the cervix and uterus and placed into the fallopian tubes. The coil causes scaring and blocks the tubes. Other forms of contraception should be used for 3 months after the procedure to allow the scar tissue to form completely. Additionally, it is required hysterosalpingography be done 3 months later to ensure that the procedure was successful. Hysterosalpingography is a procedure that uses X-rays to look at your uterus and fallopian tubes after a material to make them show up better has been inserted. IS STERILIZATION SAFE? Sterilization is considered safe with very rare complications. Risks depend on the type of procedure you have. As with any surgical procedure, there are risks. Some risks of sterilization by any means include:   Bleeding.  Infection.  Reaction to anesthesia medicine.  Injury to surrounding organs. Risks specific to having hysteroscopic coils placed include:  The coils may not be placed correctly the first time.   The coils may move out of place.   The tubes may not get completely blocked after 3 months.   Injury to surrounding organs when placing the coil.  HOW EFFECTIVE IS FEMALE STERILIZATION? Sterilization is nearly 100% effective, but it can fail. Depending on the type of sterilization, the rate of failure can be as high as 3%. After hysteroscopic sterilization with placement of fallopian tube coils, you will need back-up birth control for 3 months after the procedure. Sterilization is effective for a lifetime.  BENEFITS OF STERILIZATION  It does   not affect your hormones, and therefore will not affect your menstrual periods, sexual desire, or performance.   It is effective for a lifetime.   It is safe.   You do not need to worry about getting pregnant. Keep in mind that if you had the  hysteroscopic placement procedure, you must wait 3 months after the procedure (or until your caregiver confirms) before pregnancy is not considered possible.   There are no side effects unlike other types of birth control (contraception).  DRAWBACKS OF STERILIZATION  You must be sure you do not want children or any more children. The procedure is permanent.   It does not provide protection against sexually transmitted infections (STIs).   The tubes can grow back together. If this happens, there is a risk of pregnancy. There is also an increased risk (50%) of pregnancy being an ectopic pregnancy. This is a pregnancy that happens outside of the uterus. Document Released: 01/25/2008 Document Revised: 02/07/2012 Document Reviewed: 11/24/2011 [employer] Patient Information 2014 [employer].  

## 2013-05-02 NOTE — Progress Notes (Signed)
.   Subjective:     Kristin Payne is a 32 y.o. female who presents for a postpartum visit. She is 8 weeks postpartum following a spontaneous vaginal delivery. I have fully reviewed the prenatal and intrapartum course. The delivery was at 38 gestational weeks. Outcome: spontaneous vaginal delivery. Anesthesia: epidural. Postpartum course has been normal. Baby's course has been normall. Baby is feeding by both breast and bottle - Gerber. Bleeding no bleeding. Bowel function is normal. Bladder function is normal. Patient is not sexually active. Contraception method is none. Postpartum depression screening: negative.  The following portions of the patient's history were reviewed and updated as appropriate: allergies, current medications, past family history, past medical history, past social history, past surgical history and problem list.  Review of Systems Pertinent items are noted in HPI.   Objective:    BP 113/74  Pulse 76  Temp(Src) 98.8 F (37.1 C) (Oral)  Ht 5\' 9"  (1.753 m)  Wt 130 lb 9.6 oz (59.24 kg)  BMI 19.28 kg/m2  LMP 04/12/2013  Breastfeeding? Yes        General:  alert     Abdomen: soft, non-tender; bowel sounds normal; no masses,  no organomegaly   Vulva:  normal  Vagina: normal vagina  Cervix:  no lesions  Corpus: normal size, contour, position, consistency, mobility, non-tender  Adnexa:  normal adnexa   Assessment:     Normal postpartum exam.   Plan:   Depo provera for now.  Plans laparoscopic BTL.  Follow up as needed.

## 2013-05-23 ENCOUNTER — Other Ambulatory Visit: Payer: Self-pay | Admitting: Podiatry

## 2013-05-23 ENCOUNTER — Ambulatory Visit: Payer: Self-pay

## 2013-05-23 ENCOUNTER — Encounter: Payer: Self-pay | Admitting: Podiatry

## 2013-05-23 ENCOUNTER — Ambulatory Visit (INDEPENDENT_AMBULATORY_CARE_PROVIDER_SITE_OTHER): Payer: 59 | Admitting: Podiatry

## 2013-05-23 ENCOUNTER — Ambulatory Visit (INDEPENDENT_AMBULATORY_CARE_PROVIDER_SITE_OTHER): Payer: 59

## 2013-05-23 VITALS — BP 104/67 | HR 81 | Resp 16

## 2013-05-23 DIAGNOSIS — M722 Plantar fascial fibromatosis: Secondary | ICD-10-CM

## 2013-05-23 DIAGNOSIS — S92001A Unspecified fracture of right calcaneus, initial encounter for closed fracture: Secondary | ICD-10-CM

## 2013-05-23 DIAGNOSIS — M779 Enthesopathy, unspecified: Secondary | ICD-10-CM

## 2013-05-23 DIAGNOSIS — S92009A Unspecified fracture of unspecified calcaneus, initial encounter for closed fracture: Secondary | ICD-10-CM

## 2013-05-23 NOTE — Progress Notes (Signed)
Rt heel pain/breast feeding no trauma

## 2013-05-23 NOTE — Progress Notes (Signed)
This 32 year old female presents today with severe pain to the right lower extremity particularly around the calcaneus and the tendo Achilles area. She denies any occult trauma. States that the pain is dull and achy occasionally lancinating. With radiating pain up the posterior aspect of the right heel. Confirms mild edema area. She states this started approximately last Saturday morning she was unable to put her foot to the floor. She denied any erythema cellulitis or ecchymosis. She is currently breast-feeding and has taken no medication to help alleviate her symptoms. She has tried icing and immobilization to no avail.  Objective: Vital signs are stable she is alert and oriented x3. Pulses are palpable bilateral lower extremities. Neurologic sensorium is intact bilaterally. Deep tendon reflexes are intact bilateral. She does have pain on percussion of her Achilles tendon right. Muscle strength is intact 5 out of 5 dorsiflexors plantar flexors inverters and evertors slightly weaker on the right side with plantar flexion. She has severe pain in the medial lateral compression of the calcaneus right and tenderness on palpation the tendo Achilles as it inserts in the  posterior aspect of the calcaneus right. Radiographic evaluation demonstrates what appears to be a fractured calcaneus, non-comminuted nondisplaced right heel. She does have some soft tissue increase in density in the tendo Achilles at the insertion site of the posterior aspect of the right heel, no posterior spurring noted. There is no erythema cellulitis drainage or odor. No ecchymosis is found. Mild edema around the calcaneus.  Assessment: Fracture calcaneus right. Rule out tendinopathy of the tendo Achilles right.  Plan: Discussed etiology pathology conservative versus surgical therapy is with her today in great detail. At this point I feel it best to send her for a CT scan of her right calcaneus for possible surgical reconstructive necessary.  I am also requesting an arthritic profile to evaluate the tendinopathy of the Achilles. More than likely the Achilles pain as a consequence of a traumatic injury to the calcaneus. She was placed in a Cam Walker today. She was given no medication for her pain at this time. Referrals were made for blood draw as well as a CT scan. I will followup with her once those labs come in.

## 2013-05-23 NOTE — Patient Instructions (Addendum)
Wear the Cam Walker at all times.  Obtain lab work.  Keep the right leg and foot elevated as much as possible. Follow up with me in three weeks.

## 2013-05-24 ENCOUNTER — Telehealth: Payer: Self-pay | Admitting: *Deleted

## 2013-05-24 LAB — ANTI-NUCLEAR AB-TITER (ANA TITER)

## 2013-05-24 LAB — ARTHRITIS PANEL
Rhuematoid fact SerPl-aCnc: <10 IU/mL (ref <=14)
Uric Acid, Serum: 3.6 mg/dL (ref 2.4–7.0)

## 2013-05-24 NOTE — Telephone Encounter (Signed)
Message copied by Marissa Nestle on Fri May 24, 2013  9:07 AM ------      Message from: Ernestene Kiel T      Created: Thu May 23, 2013  5:40 PM       Severe pain right heel. Started last Saturday.  Radiographs deomonstrate possible fracture. Put in CAM boot.  Surgical consideration.      ----- Message -----         From: Marissa Nestle, RN         Sent: 05/23/2013   2:55 PM           To: Max Maud Deed, DPM            Dr Al Corpus, I need to get this pt's CT pre-auth and do not have enough information to do so.  I need start date and symptoms.  Thanks much! Joya San       ------

## 2013-05-29 ENCOUNTER — Other Ambulatory Visit: Payer: 59

## 2013-05-30 ENCOUNTER — Ambulatory Visit
Admission: RE | Admit: 2013-05-30 | Discharge: 2013-05-30 | Disposition: A | Discharge status: Home / Self Care | Payer: 59 | Source: Ambulatory Visit | Attending: Podiatry | Admitting: Podiatry

## 2013-05-30 DIAGNOSIS — S92001A Unspecified fracture of right calcaneus, initial encounter for closed fracture: Secondary | ICD-10-CM

## 2013-06-18 ENCOUNTER — Ambulatory Visit (INDEPENDENT_AMBULATORY_CARE_PROVIDER_SITE_OTHER): Payer: 59 | Admitting: Podiatry

## 2013-06-18 ENCOUNTER — Encounter: Payer: Self-pay | Admitting: Podiatry

## 2013-06-18 VITALS — BP 119/76 | HR 84 | Resp 16

## 2013-06-18 DIAGNOSIS — M722 Plantar fascial fibromatosis: Secondary | ICD-10-CM

## 2013-06-18 MED ORDER — MELOXICAM 15 MG PO TABS: 15.0000 mg | ORAL_TABLET | Freq: Every day | ORAL | Status: DC

## 2013-06-18 MED ORDER — METHYLPREDNISOLONE (PAK) 4 MG PO TABS: ORAL_TABLET | ORAL | Status: DC

## 2013-06-18 NOTE — Patient Instructions (Signed)
Plantar Fasciitis (Heel Spur Syndrome) with Rehab The plantar fascia is a fibrous, ligament-like, soft-tissue structure that spans the bottom of the foot. Plantar fasciitis is a condition that causes pain in the foot due to inflammation of the tissue. SYMPTOMS   Pain and tenderness on the underneath side of the foot.  Pain that worsens with standing or walking. CAUSES  Plantar fasciitis is caused by irritation and injury to the plantar fascia on the underneath side of the foot. Common mechanisms of injury include:  Direct trauma to bottom of the foot.  Damage to a small nerve that runs under the foot where the main fascia attaches to the heel bone.  Stress placed on the plantar fascia due to bone spurs. RISK INCREASES WITH:   Activities that place stress on the plantar fascia (running, jumping, pivoting, or cutting).  Poor strength and flexibility.  Improperly fitted shoes.  Tight calf muscles.  Flat feet.  Failure to warm-up properly before activity.  Obesity. PREVENTION  Warm up and stretch properly before activity.  Allow for adequate recovery between workouts.  Maintain physical fitness:  Strength, flexibility, and endurance.  Cardiovascular fitness.  Maintain a health body weight.  Avoid stress on the plantar fascia.  Wear properly fitted shoes, including arch supports for individuals who have flat feet. PROGNOSIS  If treated properly, then the symptoms of plantar fasciitis usually resolve without surgery. However, occasionally surgery is necessary. RELATED COMPLICATIONS   Recurrent symptoms that may result in a chronic condition.  Problems of the lower back that are caused by compensating for the injury, such as limping.  Pain or weakness of the foot during push-off following surgery.  Chronic inflammation, scarring, and partial or complete fascia tear, occurring more often from repeated injections. TREATMENT  Treatment initially involves the use of  ice and medication to help reduce pain and inflammation. The use of strengthening and stretching exercises may help reduce pain with activity, especially stretches of the Achilles tendon. These exercises may be performed at home or with a therapist. Your caregiver may recommend that you use heel cups of arch supports to help reduce stress on the plantar fascia. Occasionally, corticosteroid injections are given to reduce inflammation. If symptoms persist for greater than 6 months despite non-surgical (conservative), then surgery may be recommended.  MEDICATION   If pain medication is necessary, then nonsteroidal anti-inflammatory medications, such as aspirin and ibuprofen, or other minor pain relievers, such as acetaminophen, are often recommended.  Do not take pain medication within 7 days before surgery.  Prescription pain relievers may be given if deemed necessary by your caregiver. Use only as directed and only as much as you need.  Corticosteroid injections may be given by your caregiver. These injections should be reserved for the most serious cases, because they may only be given a certain number of times. HEAT AND COLD  Cold treatment (icing) relieves pain and reduces inflammation. Cold treatment should be applied for 10 to 15 minutes every 2 to 3 hours for inflammation and pain and immediately after any activity that aggravates your symptoms. Use ice packs or massage the area with a piece of ice (ice massage).  Heat treatment may be used prior to performing the stretching and strengthening activities prescribed by your caregiver, physical therapist, or athletic trainer. Use a heat pack or soak the injury in warm water. SEEK IMMEDIATE MEDICAL CARE IF:  Treatment seems to offer no benefit, or the condition worsens.  Any medications produce adverse side effects. EXERCISES RANGE   OF MOTION (ROM) AND STRETCHING EXERCISES - Plantar Fasciitis (Heel Spur Syndrome) These exercises may help you  when beginning to rehabilitate your injury. Your symptoms may resolve with or without further involvement from your physician, physical therapist or athletic trainer. While completing these exercises, remember:   Restoring tissue flexibility helps normal motion to return to the joints. This allows healthier, less painful movement and activity.  An effective stretch should be held for at least 30 seconds.  A stretch should never be painful. You should only feel a gentle lengthening or release in the stretched tissue. RANGE OF MOTION - Toe Extension, Flexion  Sit with your right / left leg crossed over your opposite knee.  Grasp your toes and gently pull them back toward the top of your foot. You should feel a stretch on the bottom of your toes and/or foot.  Hold this stretch for __________ seconds.  Now, gently pull your toes toward the bottom of your foot. You should feel a stretch on the top of your toes and or foot.  Hold this stretch for __________ seconds. Repeat __________ times. Complete this stretch __________ times per day.  RANGE OF MOTION - Ankle Dorsiflexion, Active Assisted  Remove shoes and sit on a chair that is preferably not on a carpeted surface.  Place right / left foot under knee. Extend your opposite leg for support.  Keeping your heel down, slide your right / left foot back toward the chair until you feel a stretch at your ankle or calf. If you do not feel a stretch, slide your bottom forward to the edge of the chair, while still keeping your heel down.  Hold this stretch for __________ seconds. Repeat __________ times. Complete this stretch __________ times per day.  STRETCH  Gastroc, Standing  Place hands on wall.  Extend right / left leg, keeping the front knee somewhat bent.  Slightly point your toes inward on your back foot.  Keeping your right / left heel on the floor and your knee straight, shift your weight toward the wall, not allowing your back to  arch.  You should feel a gentle stretch in the right / left calf. Hold this position for __________ seconds. Repeat __________ times. Complete this stretch __________ times per day. STRETCH  Soleus, Standing  Place hands on wall.  Extend right / left leg, keeping the other knee somewhat bent.  Slightly point your toes inward on your back foot.  Keep your right / left heel on the floor, bend your back knee, and slightly shift your weight over the back leg so that you feel a gentle stretch deep in your back calf.  Hold this position for __________ seconds. Repeat __________ times. Complete this stretch __________ times per day. STRETCH  Gastrocsoleus, Standing  Note: This exercise can place a lot of stress on your foot and ankle. Please complete this exercise only if specifically instructed by your caregiver.   Place the ball of your right / left foot on a step, keeping your other foot firmly on the same step.  Hold on to the wall or a rail for balance.  Slowly lift your other foot, allowing your body weight to press your heel down over the edge of the step.  You should feel a stretch in your right / left calf.  Hold this position for __________ seconds.  Repeat this exercise with a slight bend in your right / left knee. Repeat __________ times. Complete this stretch __________ times per day.    STRENGTHENING EXERCISES - Plantar Fasciitis (Heel Spur Syndrome)  These exercises may help you when beginning to rehabilitate your injury. They may resolve your symptoms with or without further involvement from your physician, physical therapist or athletic trainer. While completing these exercises, remember:   Muscles can gain both the endurance and the strength needed for everyday activities through controlled exercises.  Complete these exercises as instructed by your physician, physical therapist or athletic trainer. Progress the resistance and repetitions only as guided. STRENGTH - Towel  Curls  Sit in a chair positioned on a non-carpeted surface.  Place your foot on a towel, keeping your heel on the floor.  Pull the towel toward your heel by only curling your toes. Keep your heel on the floor.  If instructed by your physician, physical therapist or athletic trainer, add ____________________ at the end of the towel. Repeat __________ times. Complete this exercise __________ times per day. STRENGTH - Ankle Inversion  Secure one end of a rubber exercise band/tubing to a fixed object (table, pole). Loop the other end around your foot just before your toes.  Place your fists between your knees. This will focus your strengthening at your ankle.  Slowly, pull your big toe up and in, making sure the band/tubing is positioned to resist the entire motion.  Hold this position for __________ seconds.  Have your muscles resist the band/tubing as it slowly pulls your foot back to the starting position. Repeat __________ times. Complete this exercises __________ times per day.  Document Released: 08/08/2005 Document Revised: 10/31/2011 Document Reviewed: 11/20/2008 [employer] Patient Information 2014 [employer]. Plantar Fasciitis Plantar fasciitis is a common condition that causes foot pain. It is soreness (inflammation) of the band of tough fibrous tissue on the bottom of the foot that runs from the heel bone (calcaneus) to the ball of the foot. The cause of this soreness may be from excessive standing, poor fitting shoes, running on hard surfaces, being overweight, having an abnormal walk, or overuse (this is common in runners) of the painful foot or feet. It is also common in aerobic exercise dancers and ballet dancers. SYMPTOMS  Most people with plantar fasciitis complain of:  Severe pain in the morning on the bottom of their foot especially when taking the first steps out of bed. This pain recedes after a few minutes of walking.  Severe pain is experienced also during walking  following a long period of inactivity.  Pain is worse when walking barefoot or up stairs DIAGNOSIS   Your caregiver will diagnose this condition by examining and feeling your foot.  Special tests such as X-rays of your foot, are usually not needed. PREVENTION   Consult a sports medicine professional before beginning a new exercise program.  Walking programs offer a good workout. With walking there is a lower chance of overuse injuries common to runners. There is less impact and less jarring of the joints.  Begin all new exercise programs slowly. If problems or pain develop, decrease the amount of time or distance until you are at a comfortable level.  Wear good shoes and replace them regularly.  Stretch your foot and the heel cords at the back of the ankle (Achilles tendon) both before and after exercise.  Run or exercise on even surfaces that are not hard. For example, asphalt is better than pavement.  Do not run barefoot on hard surfaces.  If using a treadmill, vary the incline.  Do not continue to workout if you have foot or joint   problems. Seek professional help if they do not improve. HOME CARE INSTRUCTIONS   Avoid activities that cause you pain until you recover.  Use ice or cold packs on the problem or painful areas after working out.  Only take over-the-counter or prescription medicines for pain, discomfort, or fever as directed by your caregiver.  Soft shoe inserts or athletic shoes with air or gel sole cushions may be helpful.  If problems continue or become more severe, consult a sports medicine caregiver or your own health care provider. Cortisone is a potent anti-inflammatory medication that may be injected into the painful area. You can discuss this treatment with your caregiver. MAKE SURE YOU:   Understand these instructions.  Will watch your condition.  Will get help right away if you are not doing well or get worse. Document Released: 05/03/2001 Document  Revised: 10/31/2011 Document Reviewed: 07/02/2008 [employer] Patient Information 2014 [employer].  

## 2013-06-18 NOTE — Progress Notes (Signed)
She presents today for followup of her right foot states that it really hasn't gotten much better. She continues to wear her Cam Dan Humphreys a daily basis. CT scan of the right foot does not demonstrate any type of fracture. Blood work appears to be normal. Still has pain with ambulation.  Objective: Vital signs are stable she is alert and oriented x3 pulses remain palpable right. She has no pain medial lateral of the calcaneus. She does have pain on direct palpation of the medial calcaneal tubercle of the right heel.  Assessment: Probable plantar fasciitis with some compensatory changes posterior heel right  Plan: Discussed etiology pathology conservative versus surgical therapies with her at this point in time we are going to go ahead and inject her right heel with Kenalog and local anesthetic. A put her in a plantar fascial strapping and a night splint. She will continue use of the Cam Walker during the day. He also wrote her prescription for Sterapred Dosepak to be followed by 7.5 mg of the meloxicam should she develop a rash or itching she is to stop taking it. I will followup with her in one month.

## 2013-06-27 ENCOUNTER — Other Ambulatory Visit: Payer: Self-pay

## 2013-07-10 ENCOUNTER — Other Ambulatory Visit: Payer: Self-pay | Admitting: Family Medicine

## 2013-07-10 ENCOUNTER — Other Ambulatory Visit (HOSPITAL_COMMUNITY)
Admission: RE | Admit: 2013-07-10 | Discharge: 2013-07-10 | Disposition: A | Discharge status: Home / Self Care | Payer: 59 | Source: Ambulatory Visit | Attending: Family Medicine | Admitting: Family Medicine

## 2013-07-10 DIAGNOSIS — Z0000 Encounter for general adult medical examination without abnormal findings: Secondary | ICD-10-CM | POA: Insufficient documentation

## 2013-07-23 ENCOUNTER — Ambulatory Visit: Payer: 59 | Admitting: Podiatry

## 2013-07-25 ENCOUNTER — Ambulatory Visit: Payer: Self-pay | Admitting: Obstetrics

## 2013-07-25 ENCOUNTER — Ambulatory Visit: Payer: 59

## 2013-07-25 ENCOUNTER — Ambulatory Visit (INDEPENDENT_AMBULATORY_CARE_PROVIDER_SITE_OTHER): Payer: 59 | Admitting: *Deleted

## 2013-07-25 VITALS — BP 106/67 | HR 79 | Temp 98.8°F | Ht 69.0 in | Wt 136.0 lb

## 2013-07-25 DIAGNOSIS — IMO0001 Reserved for inherently not codable concepts without codable children: Secondary | ICD-10-CM

## 2013-07-25 DIAGNOSIS — Z309 Encounter for contraceptive management, unspecified: Secondary | ICD-10-CM

## 2013-07-25 NOTE — Progress Notes (Signed)
Pt in office for depo injection. 

## 2013-10-16 ENCOUNTER — Ambulatory Visit (INDEPENDENT_AMBULATORY_CARE_PROVIDER_SITE_OTHER): Payer: 59 | Admitting: *Deleted

## 2013-10-16 VITALS — BP 113/74 | HR 87 | Wt 136.0 lb

## 2013-10-16 DIAGNOSIS — Z309 Encounter for contraceptive management, unspecified: Secondary | ICD-10-CM

## 2013-10-16 NOTE — Progress Notes (Signed)
Pt in office today for depo injection.  Injection given in [facility]. Pt tolerated well.

## 2014-01-09 ENCOUNTER — Ambulatory Visit: Payer: 59

## 2014-08-22 NOTE — L&D Delivery Note (Cosign Needed)
Final Labor Progress Note It was reported that the pt arrived to MAU at 0340 VE 3-5/80/-2.  After going to the BR pt c/o increase pain. VE by MAU nurse C/C/+1.  Upon my arrival to the room VE C/C/+3 w/BBW.  FHR remained reassuring with occasional varabable decelerations.  Vaginal Delivery Note Spontaneous rupture of membranes today, at 0419, moderate meconium.  GBS was positive but not treated d/t precipitous labor.   Spontaneous pushing began at  0420.   After 2 minutes of pushing the head, shoulders and the body of a viable female infant "Ayesha Rumpf" delivered spontaneously with maternal effort in the LOP position at [place].   With vigorous tone and spontaneous cry, the infant was placed on moms abd.  After the umbilical cord was clamped it was cut by the FOB, then cord blood was obtained for evaluation.  Spontaneous delivery of a meconium stained intact placenta with a 3 vessel velamentous cord via [place] at [phone].   Episiotomy: None   The vulva, perineum, vaginal vault, rectum and cervix were inspected, mild abrasions appreciated, no repairs needed.  IM Postpartum pitocin as ordered.  Fundus firm, lochia minimum, bleeding under control. EBL 50, Pt hemodynamically stable.   Sponge, laps and needle count correct and verified with the primary care nurse.  Attending MD available at all times.    Routine postpartum orders.   Mother desires BTL for contraception prior to discharge.  Mom plans to breastfeed.   Placenta to pathology: YES due to velamentous cord and moderate meconium                        Cord Gases sent to lab: NO Cord blood sent to lab: YES   APGARS:  8 at 1 minute and 9 at 5 minutes Weight:. pending     Both mom and baby were left in stable condition, baby skin to skin.      Kristin Payne, CNM, Kristin Payne 07/08/2015. 4:49 AM

## 2015-01-01 ENCOUNTER — Other Ambulatory Visit (HOSPITAL_COMMUNITY)
Admission: RE | Admit: 2015-01-01 | Discharge: 2015-01-01 | Disposition: A | Discharge status: Home / Self Care | Payer: 59 | Source: Ambulatory Visit | Attending: Obstetrics & Gynecology | Admitting: Obstetrics & Gynecology

## 2015-01-01 ENCOUNTER — Other Ambulatory Visit: Payer: Self-pay | Admitting: Obstetrics & Gynecology

## 2015-01-01 DIAGNOSIS — Z113 Encounter for screening for infections with a predominantly sexual mode of transmission: Secondary | ICD-10-CM | POA: Diagnosis present

## 2015-01-01 DIAGNOSIS — Z01419 Encounter for gynecological examination (general) (routine) without abnormal findings: Secondary | ICD-10-CM | POA: Insufficient documentation

## 2015-01-01 DIAGNOSIS — Z1151 Encounter for screening for human papillomavirus (HPV): Secondary | ICD-10-CM | POA: Diagnosis present

## 2015-01-02 LAB — CYTOLOGY - PAP

## 2015-03-02 ENCOUNTER — Telehealth: Payer: Self-pay | Admitting: Hematology and Oncology

## 2015-03-02 NOTE — Telephone Encounter (Signed)
new patient appt-s/w patient and gave np appt for 07/21 @ 2 w/dr. Alvy Bimler

## 2015-03-12 ENCOUNTER — Encounter: Payer: Self-pay | Admitting: Hematology and Oncology

## 2015-03-12 ENCOUNTER — Telehealth: Payer: Self-pay | Admitting: Hematology and Oncology

## 2015-03-12 ENCOUNTER — Ambulatory Visit (HOSPITAL_BASED_OUTPATIENT_CLINIC_OR_DEPARTMENT_OTHER): Payer: 59 | Admitting: Hematology and Oncology

## 2015-03-12 ENCOUNTER — Ambulatory Visit: Payer: 59

## 2015-03-12 ENCOUNTER — Other Ambulatory Visit: Payer: 59

## 2015-03-12 VITALS — BP 106/60 | HR 100 | Temp 98.3°F | Resp 18 | Ht 69.0 in | Wt 143.0 lb

## 2015-03-12 DIAGNOSIS — D509 Iron deficiency anemia, unspecified: Secondary | ICD-10-CM | POA: Diagnosis not present

## 2015-03-12 DIAGNOSIS — O99012 Anemia complicating pregnancy, second trimester: Secondary | ICD-10-CM

## 2015-03-12 HISTORY — DX: Anemia complicating pregnancy, second trimester: O99.012

## 2015-03-12 HISTORY — DX: Iron deficiency anemia, unspecified: D50.9

## 2015-03-12 NOTE — Assessment & Plan Note (Addendum)
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 Her records indicate that she might have sickle cell trait. I'm wondering whether she could have sickle cell trait with a component of thalassemia or other hemoglobinopathy that could cause her to have chronic microcytosis. We discussed a lot of different options for this. Ultimately, she would want to receive IV iron. Recommend she take Flintstone multivitamin. The patient has stopped taking prenatal vitamins because it make her nauseated. I plan to order IV iron for her starting next week and bring her back at the end of the week to assess blood draw, recheck iron studies and to also repeat hemoglobinopathy evaluation

## 2015-03-12 NOTE — Assessment & Plan Note (Signed)
The most likely cause of her anemia is due to history of chronic blood loss and anemia of pregnancy. We discussed some of the risks, benefits, and alternatives of intravenous iron infusions. The patient is symptomatic from anemia and the iron level is critically low. She tolerated oral iron supplement poorly and desires to achieved higher levels of iron faster for adequate hematopoesis. Some of the side-effects to be expected including risks of infusion reactions, phlebitis, headaches, nausea and fatigue.  The patient is willing to proceed. Patient education material was dispensed.  Goal is to keep ferritin level greater than 50

## 2015-03-12 NOTE — Telephone Encounter (Signed)
Gave patient avs report and appointments for August and September. Patient has a supportive care therapy hyper link on pof for venofer. Per patient venofer being arranged at [facility].

## 2015-03-12 NOTE — Progress Notes (Signed)
Checked in new pt with no financial concerns. °

## 2015-03-12 NOTE — Patient Instructions (Signed)
Iron Sucrose injection  What is this medicine?  IRON SUCROSE (AHY ern SOO krohs) is an iron complex. Iron is used to make healthy red blood cells, which carry oxygen and nutrients throughout the body. This medicine is used to treat iron deficiency anemia in people with chronic kidney disease.  This medicine may be used for other purposes; ask your health care provider or pharmacist if you have questions.  COMMON BRAND NAME(S): Venofer  What should I tell my health care provider before I take this medicine?  They need to know if you have any of these conditions:  -anemia not caused by low iron levels  -heart disease  -high levels of iron in the blood  -kidney disease  -liver disease  -an unusual or allergic reaction to iron, other medicines, foods, dyes, or preservatives  -pregnant or trying to get pregnant  -breast-feeding  How should I use this medicine?  This medicine is for infusion into a vein. It is given by a health care professional in a hospital or clinic setting.  Talk to your pediatrician regarding the use of this medicine in children. While this drug may be prescribed for children as young as 2 years for selected conditions, precautions do apply.  Overdosage: If you think you have taken too much of this medicine contact a poison control center or emergency room at once.  NOTE: This medicine is only for you. Do not share this medicine with others.  What if I miss a dose?  It is important not to miss your dose. Call your doctor or health care professional if you are unable to keep an appointment.  What may interact with this medicine?  Do not take this medicine with any of the following medications:  -deferoxamine  -dimercaprol  -other iron products  This medicine may also interact with the following medications:  -chloramphenicol  -deferasirox  This list may not describe all possible interactions. Give your health care provider a list of all the medicines, herbs, non-prescription drugs, or dietary  supplements you use. Also tell them if you smoke, drink alcohol, or use illegal drugs. Some items may interact with your medicine.  What should I watch for while using this medicine?  Visit your doctor or healthcare professional regularly. Tell your doctor or healthcare professional if your symptoms do not start to get better or if they get worse. You may need blood work done while you are taking this medicine.  You may need to follow a special diet. Talk to your doctor. Foods that contain iron include: whole grains/cereals, dried fruits, beans, or peas, leafy green vegetables, and organ meats (liver, kidney).  What side effects may I notice from receiving this medicine?  Side effects that you should report to your doctor or health care professional as soon as possible:  -allergic reactions like skin rash, itching or hives, swelling of the face, lips, or tongue  -breathing problems  -changes in blood pressure  -cough  -fast, irregular heartbeat  -feeling faint or lightheaded, falls  -fever or chills  -flushing, sweating, or hot feelings  -joint or muscle aches/pains  -seizures  -swelling of the ankles or feet  -unusually weak or tired  Side effects that usually do not require medical attention (report to your doctor or health care professional if they continue or are bothersome):  -diarrhea  -feeling achy  -headache  -irritation at site where injected  -nausea, vomiting  -stomach upset  -tiredness  This list may not describe all possible   side effects. Call your doctor for medical advice about side effects. You may report side effects to FDA at 1-800-FDA-1088.  Where should I keep my medicine?  This drug is given in a hospital or clinic and will not be stored at home.  NOTE: This sheet is a summary. It may not cover all possible information. If you have questions about this medicine, talk to your doctor, pharmacist, or health care provider.   2015, Elsevier/Gold Standard. (2011-05-19 17:14:35)

## 2015-03-12 NOTE — Progress Notes (Signed)
[place] CONSULT NOTE  Patient Care Team: Antony Blackbird, MD as PCP - General (Family Medicine)  CHIEF COMPLAINTS/PURPOSE OF CONSULTATION:  Severe iron deficiency anemia, currently pregnant  HISTORY OF PRESENTING ILLNESS:  Kristin Payne 33 y.o. female is here because of severe iron deficiency anemia. The patient has been anemic all her life. She also has sickle cell trait. Her highest hemoglobin would be around 11 in the past. The patient had 5 pregnancies. She is currently [redacted] weeks pregnant with expected due date around 03/11/2015. She complained of profound fatigue.  She was found to have abnormal CBC from recent OB visit with a hemoglobin of 8.3. She has significant microcytosis. She denies recent chest pain on exertion, shortness of breath on minimal exertion, pre-syncopal episodes, or palpitations. She complained of leg cramps, dizziness, lightheadedness with excessive pica She had not noticed any recent bleeding such as epistaxis, hematuria or hematochezia The patient denies over the counter NSAID ingestion. She is not on antiplatelets agents.  She had no prior history or diagnosis of cancer. Her age appropriate screening programs are up-to-date. She eats a variety of diet. She never donated blood or received blood transfusion The patient was prescribed oral iron supplements and she takes various different formulas which caused significant nausea and constipation.  MEDICAL HISTORY:  Past Medical History  Diagnosis Date  . Anemia   . Allergy   . Plantar fasciitis   . Iron deficiency anemia 03/12/2015  . Anemia affecting pregnancy in second trimester, antepartum 03/12/2015    SURGICAL HISTORY: Past Surgical History  Procedure Laterality Date  . Cystectomy  2006    left  . Mouth surgery Left     SOCIAL HISTORY: History   Social History  . Marital Status: Married    Spouse Name: N/A  . Number of Children: N/A  . Years of Education: N/A    Occupational History  . Not on file.   Social History Main Topics  . Smoking status: Never Smoker   . Smokeless tobacco: Never Used  . Alcohol Use: No  . Drug Use: No  . Sexual Activity: Yes    Birth Control/ Protection: Injection   Other Topics Concern  . Not on file   Social History Narrative    FAMILY HISTORY: Family History  Problem Relation Age of Onset  . Hypertension Mother   . Miscarriages / [place] Mother     ALLERGIES:  is allergic to aleve and motrin.  MEDICATIONS:  Current Outpatient Prescriptions  Medication Sig Dispense Refill  . docusate sodium (COLACE) 100 MG capsule Take 100 mg by mouth 2 (two) times daily.    . Ferrous Sulfate (SLOW FE PO) Take by mouth daily.    Marland Kitchen loratadine (CLARITIN) 10 MG tablet Take 10 mg by mouth daily.     No current facility-administered medications for this visit.    REVIEW OF SYSTEMS:   Constitutional: Denies fevers, chills or abnormal night sweats Eyes: Denies blurriness of vision, double vision or watery eyes Ears, nose, mouth, throat, and face: Denies mucositis or sore throat Respiratory: Denies cough, dyspnea or wheezes Cardiovascular: Denies palpitation, chest discomfort or lower extremity swelling Gastrointestinal:  Denies nausea, heartburn or change in bowel habits Skin: Denies abnormal skin rashes Lymphatics: Denies new lymphadenopathy or easy bruising Neurological:Denies numbness, tingling or new weaknesses Behavioral/Psych: Mood is stable, no new changes  All other systems were reviewed with the patient and are negative.  PHYSICAL EXAMINATION: ECOG PERFORMANCE STATUS: 1 - Symptomatic but completely ambulatory  Filed Vitals:   03/12/15 1417  BP: 106/60  Pulse: 100  Temp: 98.3 F (36.8 C)  Resp: 18   Filed Weights   03/12/15 1417  Weight: 143 lb (64.864 kg)    GENERAL:alert, no distress and comfortable SKIN: skin color, texture, turgor are normal, no rashes or significant lesions EYES:  normal, conjunctiva are pink and non-injected, sclera clear OROPHARYNX:no exudate, no erythema and lips, buccal mucosa, and tongue normal  NECK: supple, thyroid normal size, non-tender, without nodularity LYMPH:  no palpable lymphadenopathy in the cervical, axillary or inguinal LUNGS: clear to auscultation and percussion with normal breathing effort HEART: regular rate & rhythm and no murmurs and no lower extremity edema ABDOMEN:abdomen soft, non-tender and normal bowel sounds Musculoskeletal:no cyanosis of digits and no clubbing  PSYCH: alert & oriented x 3 with fluent speech NEURO: no focal motor/sensory deficits  LABORATORY DATA:  I have reviewed the data as listed Recent Results (from the past 2160 hour(s))  Cytology - PAP     Status: None   Collection Time: 01/01/15 12:00 AM  Result Value Ref Range   CYTOLOGY - PAP PAP RESULT     ASSESSMENT & PLAN:  Anemia affecting pregnancy in second trimester, antepartum She has multifactorial anemia, 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 Her records indicate that she might have sickle cell trait. I'm wondering whether she could have sickle cell trait with a component of thalassemia or other hemoglobinopathy that could cause her to have chronic microcytosis. We discussed a lot of different options for this. Ultimately, she would want to receive IV iron. Recommend she take Flintstone multivitamin. The patient has stopped taking prenatal vitamins because it make her nauseated. I plan to order IV iron for her starting next week and bring her back at the end of the week to assess blood draw, recheck iron studies and to also repeat hemoglobinopathy evaluation  Iron deficiency anemia The most likely cause of her anemia is due to history of chronic blood loss and anemia of pregnancy. We discussed some of the risks, benefits, and alternatives of intravenous iron infusions. The patient is  symptomatic from anemia and the iron level is critically low. She tolerated oral iron supplement poorly and desires to achieved higher levels of iron faster for adequate hematopoesis. Some of the side-effects to be expected including risks of infusion reactions, phlebitis, headaches, nausea and fatigue.  The patient is willing to proceed. Patient education material was dispensed.  Goal is to keep ferritin level greater than 50     All questions were answered. The patient knows to call the clinic with any problems, questions or concerns. I spent 40 minutes counseling the patient face to face. The total time spent in the appointment was 60 minutes and more than 50% was on counseling.     [facility], [place], MD 03/12/2015 4:52 PM

## 2015-03-13 ENCOUNTER — Telehealth: Payer: Self-pay | Admitting: *Deleted

## 2015-03-13 NOTE — Telephone Encounter (Signed)
Notified Erasmo Downer, at [facility] Adult ICU Phone [phone], of orders for pt to received IV Venofer on Wednesday 7/27 and again on 8/3.   Erasmo Downer took down pt's information and instructs for pt to call their Unit at [phone] around 6 am on 7/27 and again on 8/3 to get a time to come in that day.  They cannot schedule pt ahead of time, but they will arrange for her appt same day.   Notified pt of above instructions.  Call us if she has any problems.  She verbalized understanding.  I notified Leda Gauze in Pharmacy at [facility] at phone [phone] of orders for Venofer placed under Signed and Held.  Please call us back if any problems w/ orders and arrange to have medication in house on above dates. Dr. Alvy Bimler will contact OB on call prior to her Infusions.

## 2015-03-17 ENCOUNTER — Encounter: Payer: Self-pay | Admitting: *Deleted

## 2015-03-18 ENCOUNTER — Inpatient Hospital Stay (HOSPITAL_COMMUNITY)
Admission: AD | Admit: 2015-03-18 | Discharge: 2015-03-18 | DRG: 781 | Disposition: A | Discharge status: Home / Self Care | Payer: 59 | Source: Ambulatory Visit | Attending: Obstetrics & Gynecology | Admitting: Obstetrics & Gynecology

## 2015-03-18 ENCOUNTER — Encounter (HOSPITAL_COMMUNITY): Payer: Self-pay | Admitting: *Deleted

## 2015-03-18 DIAGNOSIS — D649 Anemia, unspecified: Secondary | ICD-10-CM | POA: Diagnosis present

## 2015-03-18 DIAGNOSIS — Z3A22 22 weeks gestation of pregnancy: Secondary | ICD-10-CM | POA: Diagnosis present

## 2015-03-18 DIAGNOSIS — D509 Iron deficiency anemia, unspecified: Secondary | ICD-10-CM

## 2015-03-18 DIAGNOSIS — O99012 Anemia complicating pregnancy, second trimester: Secondary | ICD-10-CM | POA: Diagnosis present

## 2015-03-18 MED ORDER — SODIUM CHLORIDE 0.9 % IV SOLN
INTRAVENOUS | Status: DC
  Administered 2015-03-18: 11:00:00 via INTRAVENOUS

## 2015-03-18 MED ORDER — SODIUM CHLORIDE 0.9 % IV SOLN: 300.0000 mg | Freq: Once | INTRAVENOUS | Status: DC

## 2015-03-18 MED ORDER — SODIUM CHLORIDE 0.9 % IV SOLN
300.0000 mg | Freq: Once | INTRAVENOUS | Status: AC
  Administered 2015-03-18: 300 mg via INTRAVENOUS
  Filled 2015-03-18: qty 15

## 2015-03-18 NOTE — MAU Note (Signed)
Pt here for iron infusion.

## 2015-03-18 NOTE — Progress Notes (Signed)
Pt discharged home ambulatory.

## 2015-03-24 ENCOUNTER — Telehealth: Payer: Self-pay | Admitting: *Deleted

## 2015-03-24 ENCOUNTER — Other Ambulatory Visit: Payer: Self-pay | Admitting: Hematology and Oncology

## 2015-03-24 DIAGNOSIS — D509 Iron deficiency anemia, unspecified: Secondary | ICD-10-CM

## 2015-03-24 NOTE — Telephone Encounter (Signed)
S/w Ebony Hail at [place] and informed of planned IV Venofer tomorrow morning.  She cannot see orders now but says it may be possible to see the orders under signed and held after pt is admitted tomorrow.  I gave Dr. Calton Dach cell phone number if they need new orders put in tomorrow.   I s/w Erasmo Downer on Adult ICU and she instructs pt will have infusion in the Maternal Admissions Unit and pt to check in there tomorrow morning as early as possible.    I called pt and she is aware of above and suggested she go to [facility] a little before 8 am so if they do have to call Dr. Alvy Bimler for orders she will be in office to put those orders in.  Pt verbalized understanding.

## 2015-03-25 ENCOUNTER — Inpatient Hospital Stay (HOSPITAL_COMMUNITY)
Admission: AD | Admit: 2015-03-25 | Discharge: 2015-03-25 | Disposition: A | Discharge status: Home / Self Care | Payer: 59 | Source: Ambulatory Visit | Attending: Obstetrics & Gynecology | Admitting: Obstetrics & Gynecology

## 2015-03-25 ENCOUNTER — Encounter (HOSPITAL_COMMUNITY): Payer: Self-pay | Admitting: *Deleted

## 2015-03-25 DIAGNOSIS — O99012 Anemia complicating pregnancy, second trimester: Secondary | ICD-10-CM | POA: Insufficient documentation

## 2015-03-25 DIAGNOSIS — Z3A23 23 weeks gestation of pregnancy: Secondary | ICD-10-CM | POA: Diagnosis not present

## 2015-03-25 DIAGNOSIS — D509 Iron deficiency anemia, unspecified: Secondary | ICD-10-CM | POA: Diagnosis not present

## 2015-03-25 MED ORDER — SODIUM CHLORIDE 0.9 % IV SOLN
300.0000 mg | Freq: Once | INTRAVENOUS | Status: AC
  Administered 2015-03-25: 300 mg via INTRAVENOUS
  Filled 2015-03-25: qty 15

## 2015-03-25 NOTE — MAU Note (Signed)
Pt here for iron infusion.

## 2015-04-10 NOTE — Discharge Summary (Signed)
Physician Discharge Summary  Patient ID: Kristin Payne MRN: [id number] DOB/AGE: [birth year] 34 y.o.  Admit date: 03/18/2015 Discharge date:03/18/2015  Admission Diagnoses: Symptomatic anemia during pregnancy  Discharge Diagnoses:  Active Problems:   Anemia affecting pregnancy in second trimester   Discharged Condition: stable  Hospital Course: 75QG [id number] in 2nd trimester pregnancy who presents for iron infusion due to symptomatic anemia.  IV Iron transfusion performed without complications.  Pt discharged home in stable condition.  Consults: hematology/oncology   Treatments: IV iron   Disposition: 01-Home or Self Care     Medication List    ASK your doctor about these medications        flintstones complete 60 MG chewable tablet  Chew 2 tablets by mouth daily.     IRON PO  Take 1 tablet by mouth daily.         SignedJanyth Pupa, M 04/10/2015, 10:02 AM

## 2015-04-15 ENCOUNTER — Telehealth: Payer: Self-pay | Admitting: Hematology and Oncology

## 2015-04-15 ENCOUNTER — Other Ambulatory Visit: Payer: 59

## 2015-04-15 NOTE — Telephone Encounter (Signed)
pt was here earlier when system was out and I called to r/s lab appt-cld & spoke to pt and gave pt r/s time & date-pt understood

## 2015-04-16 ENCOUNTER — Other Ambulatory Visit (HOSPITAL_BASED_OUTPATIENT_CLINIC_OR_DEPARTMENT_OTHER): Payer: 59

## 2015-04-16 DIAGNOSIS — D509 Iron deficiency anemia, unspecified: Secondary | ICD-10-CM

## 2015-04-16 DIAGNOSIS — O9912 Other diseases of the blood and blood-forming organs and certain disorders involving the immune mechanism complicating childbirth: Secondary | ICD-10-CM | POA: Diagnosis not present

## 2015-04-16 DIAGNOSIS — O99012 Anemia complicating pregnancy, second trimester: Secondary | ICD-10-CM

## 2015-04-16 LAB — CBC & DIFF AND RETIC
BASO%: 0.1 % (ref 0.0–2.0)
Basophils Absolute: 0 10*3/uL (ref 0.0–0.1)
EOS ABS: 0.1 10*3/uL (ref 0.0–0.5)
EOS%: 1 % (ref 0.0–7.0)
HCT: 31.7 % — ABNORMAL LOW (ref 34.8–46.6)
HGB: 9.9 g/dL — ABNORMAL LOW (ref 11.6–15.9)
Immature Retic Fract: 15.5 % — ABNORMAL HIGH (ref 1.60–10.00)
LYMPH#: 1.5 10*3/uL (ref 0.9–3.3)
LYMPH%: 13.9 % — ABNORMAL LOW (ref 14.0–49.7)
MCH: 23.2 pg — AB (ref 25.1–34.0)
MCHC: 31.2 g/dL — AB (ref 31.5–36.0)
MCV: 74.4 fL — ABNORMAL LOW (ref 79.5–101.0)
MONO#: 0.5 10*3/uL (ref 0.1–0.9)
MONO%: 4.5 % (ref 0.0–14.0)
NEUT%: 80.5 % — ABNORMAL HIGH (ref 38.4–76.8)
NEUTROS ABS: 8.7 10*3/uL — AB (ref 1.5–6.5)
Platelets: 197 10*3/uL (ref 145–400)
RBC: 4.26 10*6/uL (ref 3.70–5.45)
RDW: 25.4 % — ABNORMAL HIGH (ref 11.2–14.5)
RETIC CT ABS: 70.72 10*3/uL (ref 33.70–90.70)
Retic %: 1.66 % (ref 0.70–2.10)
WBC: 10.8 10*3/uL — AB (ref 3.9–10.3)

## 2015-04-17 LAB — IRON AND TIBC CHCC
%SAT: 10 % — AB (ref 21–57)
Iron: 45 ug/dL (ref 41–142)
TIBC: 464 ug/dL — AB (ref 236–444)
UIBC: 418 ug/dL — AB (ref 120–384)

## 2015-04-17 LAB — FERRITIN CHCC: Ferritin: 43 ng/ml (ref 9–269)

## 2015-04-20 LAB — HEMOGLOBINOPATHY EVALUATION
Hemoglobin Other: 0 %
Hgb A2 Quant: 3.3 % — ABNORMAL HIGH (ref 2.2–3.2)
Hgb A: 59.1 % — ABNORMAL LOW (ref 96.8–97.8)
Hgb F Quant: 0.4 % (ref 0.0–2.0)
Hgb S Quant: 37.2 % — ABNORMAL HIGH

## 2015-04-20 LAB — VITAMIN B12: Vitamin B-12: 277 pg/mL (ref 211–911)

## 2015-04-23 ENCOUNTER — Telehealth: Payer: Self-pay | Admitting: Hematology and Oncology

## 2015-04-23 ENCOUNTER — Telehealth: Payer: Self-pay | Admitting: *Deleted

## 2015-04-23 ENCOUNTER — Ambulatory Visit (HOSPITAL_BASED_OUTPATIENT_CLINIC_OR_DEPARTMENT_OTHER): Payer: 59 | Admitting: Hematology and Oncology

## 2015-04-23 ENCOUNTER — Encounter: Payer: Self-pay | Admitting: Hematology and Oncology

## 2015-04-23 VITALS — BP 96/65 | HR 76 | Temp 98.2°F | Resp 18 | Ht 69.0 in | Wt 143.7 lb

## 2015-04-23 DIAGNOSIS — D573 Sickle-cell trait: Secondary | ICD-10-CM | POA: Diagnosis not present

## 2015-04-23 DIAGNOSIS — O99012 Anemia complicating pregnancy, second trimester: Secondary | ICD-10-CM

## 2015-04-23 NOTE — Assessment & Plan Note (Addendum)
She tolerated IV iron well recently but it was inadequate to replace her iron store. Ideally, I will like her ferritin to be greater than 100 as she also plan to breast feed her child in the future. The most likely cause of her anemia is due to chronic blood loss/malabsorption syndrome. We discussed some of the risks, benefits, and alternatives of intravenous iron infusions. The patient is symptomatic from anemia and the iron level is critically low. She tolerated oral iron supplement poorly and desires to achieved higher levels of iron faster for adequate hematopoesis. Some of the side-effects to be expected including risks of infusion reactions, phlebitis, headaches, nausea and fatigue.  The patient is willing to proceed. Patient education material was dispensed.  Goal is to keep ferritin level greater than 100. I plan to give her 2 more doses of IV venofer at 300 mg starting next week. We will try to schedule this at [facility] with fetal monitoring. I noticed that her vitamin B-12 was borderline low. She brought with her her prenatal vitamin bottle and I reviewed the ingredients listed and noticed that neither vitamin X-72 or folic acid were listed I recommend she purchase & start over-the-counter folic acid 1 mg daily along with vitamin B-12 supplement 1 mg daily for effective erythropoiesis

## 2015-04-23 NOTE — Assessment & Plan Note (Signed)
Her microcytic anemia is improving. Recent hemoglobinopathy evaluation excluded thalassemia. She has sickle cell trait and she is aware of her diagnosis and implication.

## 2015-04-23 NOTE — Telephone Encounter (Signed)
[facility] [phone] and s/w Care Coordinator, Randolm Idol.  Informed of IV iron appt at [facility] on 9/9 at 9:45 am.  She instructs for staff to contact the OB Rapid Response RN at [phone] when pt arrives to come and perform the Fetal monitor prior to IV infusion.

## 2015-04-23 NOTE — Progress Notes (Signed)
[place] NOTE  Payne, CAMMIE, MD SUMMARY OF HEMATOLOGIC HISTORY: Kristin Payne 34 y.o. female is here because of severe iron deficiency anemia. The patient has been anemic all her life. She also has sickle cell trait. Her highest hemoglobin would be around 11 in the past. The patient had 5 pregnancies. She is currently [redacted] weeks pregnant with expected due date around 07/24/2015. She complained of profound fatigue. She complained of leg cramps, dizziness, lightheadedness with excessive pica She eats a variety of diet. She never donated blood or received blood transfusion The patient was prescribed oral iron supplements and she takes various different formulas which caused significant nausea and constipation. She received IV venofer on 03/18/2015 and 03/25/2015 INTERVAL HISTORY: Kristin Payne 34 y.o. female returns for further follow-up. Her energy level has improved since IV iron. She had a bout of viral gastroenteritis but has recovered from it. She complained of very minimum venous irritation from recent IV iron.   I have reviewed the past medical history, past surgical history, social history and family history with the patient and they are unchanged from previous note.  ALLERGIES:  is allergic to aleve and motrin.  MEDICATIONS:  Current Outpatient Prescriptions  Medication Sig Dispense Refill  . Prenatal Vit-Min-FA-Fish Oil ([employer] PRENATAL GUMMY PO) Take 2 tablets by mouth at bedtime. Happy mama gummies     No current facility-administered medications for this visit.     REVIEW OF SYSTEMS:   Constitutional: Denies fevers, chills or night sweats Eyes: Denies blurriness of vision Ears, nose, mouth, throat, and face: Denies mucositis or sore throat Respiratory: Denies cough, dyspnea or wheezes Cardiovascular: Denies palpitation, chest discomfort or lower extremity swelling Gastrointestinal:  Denies nausea, heartburn or change in bowel  habits Skin: Denies abnormal skin rashes Lymphatics: Denies new lymphadenopathy or easy bruising Neurological:Denies numbness, tingling or new weaknesses Behavioral/Psych: Mood is stable, no new changes  All other systems were reviewed with the patient and are negative.  PHYSICAL EXAMINATION: ECOG PERFORMANCE STATUS: 1 - Symptomatic but completely ambulatory  Filed Vitals:   04/23/15 0851  BP: 96/65  Pulse: 76  Temp: 98.2 F (36.8 C)  Resp: 18   Filed Weights   04/23/15 0851  Weight: 143 lb 11.2 oz (65.182 kg)    GENERAL:alert, no distress and comfortable SKIN: skin color, texture, turgor are normal, no rashes or significant lesions EYES: normal, Conjunctiva are pink and non-injected, sclera clear ABDOMEN:abdomen soft, non-tender and normal bowel sounds. Her abdomen is distended from pregnancy Musculoskeletal:no cyanosis of digits and no clubbing  NEURO: alert & oriented x 3 with fluent speech, no focal motor/sensory deficits  LABORATORY DATA:  I have reviewed the data as listed No results found for this or any previous visit (from the past 48 hour(s)).  Lab Results  Component Value Date   WBC 10.8* 04/16/2015   HGB 9.9* 04/16/2015   HCT 31.7* 04/16/2015   MCV 74.4* 04/16/2015   PLT 197 04/16/2015   ASSESSMENT & PLAN:  Anemia affecting pregnancy in second trimester, antepartum She tolerated IV iron well recently but it was inadequate to replace her iron store. Ideally, I will like her ferritin to be greater than 100 as she also plan to breast feed her child in the future. The most likely cause of her anemia is due to chronic blood loss/malabsorption syndrome. We discussed some of the risks, benefits, and alternatives of intravenous iron infusions. The patient is symptomatic from anemia and the iron level is critically low.  She tolerated oral iron supplement poorly and desires to achieved higher levels of iron faster for adequate hematopoesis. Some of the side-effects to  be expected including risks of infusion reactions, phlebitis, headaches, nausea and fatigue.  The patient is willing to proceed. Patient education material was dispensed.  Goal is to keep ferritin level greater than 100. I plan to give her 2 more doses of IV venofer at 300 mg starting next week. We will try to schedule this at [facility] with fetal monitoring. I noticed that her vitamin B-12 was borderline low. She brought with her her prenatal vitamin bottle and I reviewed the ingredients listed and noticed that neither vitamin Y-51 or folic acid were listed I recommend she purchase & start over-the-counter folic acid 1 mg daily along with vitamin B-12 supplement 1 mg daily for effective erythropoiesis   Sickle cell trait Her microcytic anemia is improving. Recent hemoglobinopathy evaluation excluded thalassemia. She has sickle cell trait and she is aware of her diagnosis and implication.     All questions were answered. The patient knows to call the clinic with any problems, questions or concerns. No barriers to learning was detected.  I spent 20 minutes counseling the patient face to face. The total time spent in the appointment was 30 minutes and more than 50% was on counseling.     [facility], Deola Rewis, MD 9/1/20169:21 AM

## 2015-04-23 NOTE — Telephone Encounter (Signed)
Gave and printed appt sched and avs for pt for Sept and OCT °

## 2015-05-01 ENCOUNTER — Ambulatory Visit (HOSPITAL_BASED_OUTPATIENT_CLINIC_OR_DEPARTMENT_OTHER): Payer: 59

## 2015-05-01 ENCOUNTER — Other Ambulatory Visit: Payer: Self-pay | Admitting: Hematology and Oncology

## 2015-05-01 VITALS — BP 107/55 | HR 86 | Temp 98.9°F | Resp 16

## 2015-05-01 DIAGNOSIS — D509 Iron deficiency anemia, unspecified: Secondary | ICD-10-CM | POA: Diagnosis not present

## 2015-05-01 DIAGNOSIS — O99012 Anemia complicating pregnancy, second trimester: Secondary | ICD-10-CM

## 2015-05-01 MED ORDER — SODIUM CHLORIDE 0.9 % IV SOLN
Freq: Once | INTRAVENOUS | Status: AC
  Administered 2015-05-01: 10:00:00 via INTRAVENOUS

## 2015-05-01 MED ORDER — SODIUM CHLORIDE 0.9 % IV SOLN
300.0000 mg | Freq: Once | INTRAVENOUS | Status: AC
  Administered 2015-05-01: 300 mg via INTRAVENOUS
  Filled 2015-05-01: qty 15

## 2015-05-01 NOTE — Progress Notes (Signed)
Pt states that after receiving her first two venofer injections that her dyspnea has improved.  Pt's fetal monitoring completed prior and post venofer injection by Rapid Response RN from [facility].

## 2015-05-01 NOTE — Patient Instructions (Signed)
Iron Sucrose injection  What is this medicine?  IRON SUCROSE (AHY ern SOO krohs) is an iron complex. Iron is used to make healthy red blood cells, which carry oxygen and nutrients throughout the body. This medicine is used to treat iron deficiency anemia in people with chronic kidney disease.  This medicine may be used for other purposes; ask your health care provider or pharmacist if you have questions.  COMMON BRAND NAME(S): Venofer  What should I tell my health care provider before I take this medicine?  They need to know if you have any of these conditions:  -anemia not caused by low iron levels  -heart disease  -high levels of iron in the blood  -kidney disease  -liver disease  -an unusual or allergic reaction to iron, other medicines, foods, dyes, or preservatives  -pregnant or trying to get pregnant  -breast-feeding  How should I use this medicine?  This medicine is for infusion into a vein. It is given by a health care professional in a hospital or clinic setting.  Talk to your pediatrician regarding the use of this medicine in children. While this drug may be prescribed for children as young as 2 years for selected conditions, precautions do apply.  Overdosage: If you think you have taken too much of this medicine contact a poison control center or emergency room at once.  NOTE: This medicine is only for you. Do not share this medicine with others.  What if I miss a dose?  It is important not to miss your dose. Call your doctor or health care professional if you are unable to keep an appointment.  What may interact with this medicine?  Do not take this medicine with any of the following medications:  -deferoxamine  -dimercaprol  -other iron products  This medicine may also interact with the following medications:  -chloramphenicol  -deferasirox  This list may not describe all possible interactions. Give your health care provider a list of all the medicines, herbs, non-prescription drugs, or dietary  supplements you use. Also tell them if you smoke, drink alcohol, or use illegal drugs. Some items may interact with your medicine.  What should I watch for while using this medicine?  Visit your doctor or healthcare professional regularly. Tell your doctor or healthcare professional if your symptoms do not start to get better or if they get worse. You may need blood work done while you are taking this medicine.  You may need to follow a special diet. Talk to your doctor. Foods that contain iron include: whole grains/cereals, dried fruits, beans, or peas, leafy green vegetables, and organ meats (liver, kidney).  What side effects may I notice from receiving this medicine?  Side effects that you should report to your doctor or health care professional as soon as possible:  -allergic reactions like skin rash, itching or hives, swelling of the face, lips, or tongue  -breathing problems  -changes in blood pressure  -cough  -fast, irregular heartbeat  -feeling faint or lightheaded, falls  -fever or chills  -flushing, sweating, or hot feelings  -joint or muscle aches/pains  -seizures  -swelling of the ankles or feet  -unusually weak or tired  Side effects that usually do not require medical attention (report to your doctor or health care professional if they continue or are bothersome):  -diarrhea  -feeling achy  -headache  -irritation at site where injected  -nausea, vomiting  -stomach upset  -tiredness  This list may not describe all possible   side effects. Call your doctor for medical advice about side effects. You may report side effects to FDA at 1-800-FDA-1088.  Where should I keep my medicine?  This drug is given in a hospital or clinic and will not be stored at home.  NOTE: This sheet is a summary. It may not cover all possible information. If you have questions about this medicine, talk to your doctor, pharmacist, or health care provider.   2015, Elsevier/Gold Standard. (2011-05-19 17:14:35)

## 2015-05-08 ENCOUNTER — Ambulatory Visit (HOSPITAL_BASED_OUTPATIENT_CLINIC_OR_DEPARTMENT_OTHER): Payer: 59

## 2015-05-08 VITALS — BP 110/58 | HR 89 | Temp 98.1°F

## 2015-05-08 DIAGNOSIS — D509 Iron deficiency anemia, unspecified: Secondary | ICD-10-CM

## 2015-05-08 DIAGNOSIS — O99012 Anemia complicating pregnancy, second trimester: Secondary | ICD-10-CM | POA: Diagnosis not present

## 2015-05-08 MED ORDER — SODIUM CHLORIDE 0.9 % IV SOLN
Freq: Once | INTRAVENOUS | Status: AC
  Administered 2015-05-08: 10:00:00 via INTRAVENOUS

## 2015-05-08 MED ORDER — SODIUM CHLORIDE 0.9 % IV SOLN
300.0000 mg | Freq: Once | INTRAVENOUS | Status: AC
  Administered 2015-05-08: 300 mg via INTRAVENOUS
  Filled 2015-05-08: qty 15

## 2015-05-08 NOTE — Patient Instructions (Signed)
Iron Sucrose injection  What is this medicine?  IRON SUCROSE (AHY ern SOO krohs) is an iron complex. Iron is used to make healthy red blood cells, which carry oxygen and nutrients throughout the body. This medicine is used to treat iron deficiency anemia in people with chronic kidney disease.  This medicine may be used for other purposes; ask your health care provider or pharmacist if you have questions.  COMMON BRAND NAME(S): Venofer  What should I tell my health care provider before I take this medicine?  They need to know if you have any of these conditions:  -anemia not caused by low iron levels  -heart disease  -high levels of iron in the blood  -kidney disease  -liver disease  -an unusual or allergic reaction to iron, other medicines, foods, dyes, or preservatives  -pregnant or trying to get pregnant  -breast-feeding  How should I use this medicine?  This medicine is for infusion into a vein. It is given by a health care professional in a hospital or clinic setting.  Talk to your pediatrician regarding the use of this medicine in children. While this drug may be prescribed for children as young as 2 years for selected conditions, precautions do apply.  Overdosage: If you think you have taken too much of this medicine contact a poison control center or emergency room at once.  NOTE: This medicine is only for you. Do not share this medicine with others.  What if I miss a dose?  It is important not to miss your dose. Call your doctor or health care professional if you are unable to keep an appointment.  What may interact with this medicine?  Do not take this medicine with any of the following medications:  -deferoxamine  -dimercaprol  -other iron products  This medicine may also interact with the following medications:  -chloramphenicol  -deferasirox  This list may not describe all possible interactions. Give your health care provider a list of all the medicines, herbs, non-prescription drugs, or dietary  supplements you use. Also tell them if you smoke, drink alcohol, or use illegal drugs. Some items may interact with your medicine.  What should I watch for while using this medicine?  Visit your doctor or healthcare professional regularly. Tell your doctor or healthcare professional if your symptoms do not start to get better or if they get worse. You may need blood work done while you are taking this medicine.  You may need to follow a special diet. Talk to your doctor. Foods that contain iron include: whole grains/cereals, dried fruits, beans, or peas, leafy green vegetables, and organ meats (liver, kidney).  What side effects may I notice from receiving this medicine?  Side effects that you should report to your doctor or health care professional as soon as possible:  -allergic reactions like skin rash, itching or hives, swelling of the face, lips, or tongue  -breathing problems  -changes in blood pressure  -cough  -fast, irregular heartbeat  -feeling faint or lightheaded, falls  -fever or chills  -flushing, sweating, or hot feelings  -joint or muscle aches/pains  -seizures  -swelling of the ankles or feet  -unusually weak or tired  Side effects that usually do not require medical attention (report to your doctor or health care professional if they continue or are bothersome):  -diarrhea  -feeling achy  -headache  -irritation at site where injected  -nausea, vomiting  -stomach upset  -tiredness  This list may not describe all possible   side effects. Call your doctor for medical advice about side effects. You may report side effects to FDA at 1-800-FDA-1088.  Where should I keep my medicine?  This drug is given in a hospital or clinic and will not be stored at home.  NOTE: This sheet is a summary. It may not cover all possible information. If you have questions about this medicine, talk to your doctor, pharmacist, or health care provider.   2015, Elsevier/Gold Standard. (2011-05-19 17:14:35)

## 2015-06-05 ENCOUNTER — Other Ambulatory Visit (HOSPITAL_BASED_OUTPATIENT_CLINIC_OR_DEPARTMENT_OTHER): Payer: 59

## 2015-06-05 DIAGNOSIS — O99012 Anemia complicating pregnancy, second trimester: Secondary | ICD-10-CM | POA: Diagnosis not present

## 2015-06-05 DIAGNOSIS — D509 Iron deficiency anemia, unspecified: Secondary | ICD-10-CM | POA: Diagnosis not present

## 2015-06-05 LAB — CBC & DIFF AND RETIC
BASO%: 0.1 % (ref 0.0–2.0)
Basophils Absolute: 0 10*3/uL (ref 0.0–0.1)
EOS%: 0.7 % (ref 0.0–7.0)
Eosinophils Absolute: 0.1 10*3/uL (ref 0.0–0.5)
HCT: 32.1 % — ABNORMAL LOW (ref 34.8–46.6)
HGB: 10.6 g/dL — ABNORMAL LOW (ref 11.6–15.9)
Immature Retic Fract: 17.9 % — ABNORMAL HIGH (ref 1.60–10.00)
LYMPH%: 14.5 % (ref 14.0–49.7)
MCH: 26.4 pg (ref 25.1–34.0)
MCHC: 33 g/dL (ref 31.5–36.0)
MCV: 79.9 fL (ref 79.5–101.0)
MONO#: 0.5 10*3/uL (ref 0.1–0.9)
MONO%: 5 % (ref 0.0–14.0)
NEUT#: 8.1 10*3/uL — ABNORMAL HIGH (ref 1.5–6.5)
NEUT%: 79.7 % — AB (ref 38.4–76.8)
PLATELETS: 159 10*3/uL (ref 145–400)
RBC: 4.02 10*6/uL (ref 3.70–5.45)
RDW: 21.9 % — ABNORMAL HIGH (ref 11.2–14.5)
RETIC CT ABS: 58.69 10*3/uL (ref 33.70–90.70)
Retic %: 1.46 % (ref 0.70–2.10)
WBC: 10.2 10*3/uL (ref 3.9–10.3)
lymph#: 1.5 10*3/uL (ref 0.9–3.3)

## 2015-06-05 LAB — IRON AND TIBC CHCC
%SAT: 14 % — AB (ref 21–57)
Iron: 51 ug/dL (ref 41–142)
TIBC: 366 ug/dL (ref 236–444)
UIBC: 315 ug/dL (ref 120–384)

## 2015-06-05 LAB — FERRITIN CHCC: FERRITIN: 82 ng/mL (ref 9–269)

## 2015-06-11 ENCOUNTER — Ambulatory Visit (HOSPITAL_BASED_OUTPATIENT_CLINIC_OR_DEPARTMENT_OTHER): Payer: 59 | Admitting: Hematology and Oncology

## 2015-06-11 ENCOUNTER — Other Ambulatory Visit: Payer: 59

## 2015-06-11 VITALS — BP 99/55 | HR 86 | Temp 98.3°F | Resp 18 | Ht 69.0 in | Wt 148.9 lb

## 2015-06-11 DIAGNOSIS — D649 Anemia, unspecified: Secondary | ICD-10-CM

## 2015-06-11 DIAGNOSIS — O99012 Anemia complicating pregnancy, second trimester: Secondary | ICD-10-CM

## 2015-06-11 NOTE — Assessment & Plan Note (Signed)
She has responded well to IV iron with improvement of energy level. I recommend she continue prenatal vitamin, folic acid and vitamin B 12. The patient declined further IV iron. After delivery, I recommend oral iron supplement for a few more months as the patient intend to nurse her baby. I recommend recheck CBC and iron study with PCP 3 months postpartum. The patient is in agreement with the plan of care. Goal ferritin 50 to 100

## 2015-06-11 NOTE — Progress Notes (Signed)
[place] NOTE  Payne, CAMMIE, MD SUMMARY OF HEMATOLOGIC HISTORY:  Kristin Payne 34 y.o. female is here because of severe iron deficiency anemia. The patient has been anemic all her life. She also has sickle cell trait. Her highest hemoglobin would be around 11 in the past. The patient had 5 pregnancies. She is currently [redacted] weeks pregnant with expected due date around 07/24/2015. She complained of profound fatigue. She complained of leg cramps, dizziness, lightheadedness with excessive pica She eats a variety of diet. She never donated blood or received blood transfusion The patient was prescribed oral iron supplements and she takes various different formulas which caused significant nausea and constipation. She received IV venofer on 03/18/2015 and 03/25/2015.  Due to persistent iron deficiency anemia, she received 2 more doses of IV iron , complicated by mild thrombophlebitis INTERVAL HISTORY: Kristin Payne 34 y.o. female returns for  Further follow-up. She had improvement of energy level. The superficial thrombophlebitis of her left arm has resolved. The patient denies any recent signs or symptoms of bleeding such as spontaneous epistaxis, hematuria or hematochezia.   I have reviewed the past medical history, past surgical history, social history and family history with the patient and they are unchanged from previous note.  ALLERGIES:  is allergic to aleve and motrin.  MEDICATIONS:  Current Outpatient Prescriptions  Medication Sig Dispense Refill  . B Complex Vitamins (B-COMPLEX/B-12 PO) Take 1,000 mg by mouth.    . FOLIC ACID PO Take by mouth.    . Prenatal Vit-Min-FA-Fish Oil ([employer] PRENATAL GUMMY PO) Take 2 tablets by mouth at bedtime. Happy mama gummies     No current facility-administered medications for this visit.     REVIEW OF SYSTEMS:   Constitutional: Denies fevers, chills or night sweats Eyes: Denies blurriness of vision Ears,  nose, mouth, throat, and face: Denies mucositis or sore throat Respiratory: Denies cough, dyspnea or wheezes Cardiovascular: Denies palpitation, chest discomfort or lower extremity swelling Gastrointestinal:  Denies nausea, heartburn or change in bowel habits Skin: Denies abnormal skin rashes Lymphatics: Denies new lymphadenopathy or easy bruising Neurological:Denies numbness, tingling or new weaknesses Behavioral/Psych: Mood is stable, no new changes  All other systems were reviewed with the patient and are negative.  PHYSICAL EXAMINATION: ECOG PERFORMANCE STATUS: 0 - Asymptomatic  Filed Vitals:   06/11/15 0942  BP: 99/55  Pulse: 86  Temp: 98.3 F (36.8 C)  Resp: 18   Filed Weights   06/11/15 0942  Weight: 148 lb 14.4 oz (67.541 kg)    GENERAL:alert, no distress and comfortable SKIN: skin color, texture, turgor are normal, no rashes or significant lesions EYES: normal, Conjunctiva are pink and non-injected, sclera clear Musculoskeletal:no cyanosis of digits and no clubbing  NEURO: alert & oriented x 3 with fluent speech, no focal motor/sensory deficits  LABORATORY DATA:  I have reviewed the data as listed No results found for this or any previous visit (from the past 48 hour(s)).  Lab Results  Component Value Date   WBC 10.2 06/05/2015   HGB 10.6* 06/05/2015   HCT 32.1* 06/05/2015   MCV 79.9 06/05/2015   PLT 159 06/05/2015   ASSESSMENT & PLAN:  Anemia affecting pregnancy in second trimester, antepartum She has responded well to IV iron with improvement of energy level. I recommend she continue prenatal vitamin, folic acid and vitamin B 12. The patient declined further IV iron. After delivery, I recommend oral iron supplement for a few more months as the patient intend to  nurse her baby. I recommend recheck CBC and iron study with PCP 3 months postpartum. The patient is in agreement with the plan of care. Goal ferritin 50 to 100   All questions were answered. The  patient knows to call the clinic with any problems, questions or concerns. No barriers to learning was detected.  I spent 15 minutes counseling the patient face to face. The total time spent in the appointment was 20 minutes and more than 50% was on counseling.     [name], MD 10/20/20169:58 AM

## 2015-06-18 MED ORDER — PHENYLEPHRINE 8 MG IN D5W 100 ML (0.08MG/ML) PREMIX OPTIME
INJECTION | INTRAVENOUS | Status: AC
  Filled 2015-06-18: qty 100

## 2015-06-18 MED ORDER — MORPHINE SULFATE (PF) 0.5 MG/ML IJ SOLN
INTRAMUSCULAR | Status: AC
  Filled 2015-06-18: qty 100

## 2015-06-18 MED ORDER — ONDANSETRON HCL 4 MG/2ML IJ SOLN
INTRAMUSCULAR | Status: AC
  Filled 2015-06-18: qty 2

## 2015-06-18 MED ORDER — OXYTOCIN 10 UNIT/ML IJ SOLN
INTRAMUSCULAR | Status: AC
  Filled 2015-06-18: qty 4

## 2015-06-18 MED ORDER — FENTANYL CITRATE (PF) 100 MCG/2ML IJ SOLN
INTRAMUSCULAR | Status: AC
  Filled 2015-06-18: qty 4

## 2015-07-06 ENCOUNTER — Inpatient Hospital Stay (HOSPITAL_COMMUNITY)
Admission: AD | Admit: 2015-07-06 | Discharge: 2015-07-07 | Disposition: A | Discharge status: Home / Self Care | Payer: 59 | Source: Ambulatory Visit | Attending: Obstetrics & Gynecology | Admitting: Obstetrics & Gynecology

## 2015-07-06 NOTE — MAU Note (Addendum)
PT  SAYS SHE  HAS  BEEN  HURTING  SINCE  830PM.         HAS  HX -  HSV-  LAST  OUT BREAK-  NONE  DURING  PREG.          HAS  RX  FOR [facility]-  FILLED -   HAS  NOT  STARTED  TAKING   MED.        DENIES  MRSA.  GBS- POSITIVE.  VE  IN OFFICE  CLOSED/

## 2015-07-08 ENCOUNTER — Encounter (HOSPITAL_COMMUNITY): Payer: Self-pay | Admitting: *Deleted

## 2015-07-08 ENCOUNTER — Inpatient Hospital Stay (HOSPITAL_COMMUNITY)
Admission: AD | Admit: 2015-07-08 | Discharge: 2015-07-10 | DRG: 767 | Disposition: A | Discharge status: Home / Self Care | Payer: 59 | Source: Ambulatory Visit | Attending: Obstetrics and Gynecology | Admitting: Obstetrics and Gynecology

## 2015-07-08 DIAGNOSIS — IMO0001 Reserved for inherently not codable concepts without codable children: Secondary | ICD-10-CM

## 2015-07-08 DIAGNOSIS — K219 Gastro-esophageal reflux disease without esophagitis: Secondary | ICD-10-CM | POA: Diagnosis present

## 2015-07-08 DIAGNOSIS — O9962 Diseases of the digestive system complicating childbirth: Secondary | ICD-10-CM | POA: Diagnosis present

## 2015-07-08 DIAGNOSIS — O43123 Velamentous insertion of umbilical cord, third trimester: Secondary | ICD-10-CM | POA: Diagnosis present

## 2015-07-08 DIAGNOSIS — Z3A38 38 weeks gestation of pregnancy: Secondary | ICD-10-CM | POA: Diagnosis not present

## 2015-07-08 DIAGNOSIS — O9902 Anemia complicating childbirth: Secondary | ICD-10-CM | POA: Diagnosis present

## 2015-07-08 DIAGNOSIS — Z8249 Family history of ischemic heart disease and other diseases of the circulatory system: Secondary | ICD-10-CM

## 2015-07-08 DIAGNOSIS — O99824 Streptococcus B carrier state complicating childbirth: Secondary | ICD-10-CM | POA: Diagnosis present

## 2015-07-08 DIAGNOSIS — Z302 Encounter for sterilization: Secondary | ICD-10-CM | POA: Diagnosis not present

## 2015-07-08 DIAGNOSIS — O9832 Other infections with a predominantly sexual mode of transmission complicating childbirth: Secondary | ICD-10-CM | POA: Diagnosis present

## 2015-07-08 DIAGNOSIS — A6000 Herpesviral infection of urogenital system, unspecified: Secondary | ICD-10-CM | POA: Diagnosis present

## 2015-07-08 DIAGNOSIS — D509 Iron deficiency anemia, unspecified: Secondary | ICD-10-CM | POA: Diagnosis present

## 2015-07-08 DIAGNOSIS — O26893 Other specified pregnancy related conditions, third trimester: Secondary | ICD-10-CM | POA: Diagnosis present

## 2015-07-08 LAB — CBC
HCT: 33.9 % — ABNORMAL LOW (ref 36.0–46.0)
Hemoglobin: 11.6 g/dL — ABNORMAL LOW (ref 12.0–15.0)
MCH: 27.7 pg (ref 26.0–34.0)
MCHC: 34.2 g/dL (ref 30.0–36.0)
MCV: 80.9 fL (ref 78.0–100.0)
PLATELETS: 201 10*3/uL (ref 150–400)
RBC: 4.19 MIL/uL (ref 3.87–5.11)
RDW: 18.4 % — ABNORMAL HIGH (ref 11.5–15.5)
WBC: 13.6 10*3/uL — AB (ref 4.0–10.5)

## 2015-07-08 LAB — TYPE AND SCREEN
ABO/RH(D): O POS
Antibody Screen: NEGATIVE

## 2015-07-08 LAB — RAPID HIV SCREEN (HIV 1/2 AB+AG)
HIV 1/2 Antibodies: NONREACTIVE
HIV-1 P24 Antigen - HIV24: NONREACTIVE

## 2015-07-08 LAB — RPR: RPR Ser Ql: NONREACTIVE

## 2015-07-08 LAB — ABO/RH: ABO/RH(D): O POS

## 2015-07-08 MED ORDER — PRENATAL MULTIVITAMIN CH
1.0000 | ORAL_TABLET | Freq: Every day | ORAL | Status: DC
  Filled 2015-07-08 (×2): qty 1

## 2015-07-08 MED ORDER — TETANUS-DIPHTH-ACELL PERTUSSIS 5-2.5-18.5 LF-MCG/0.5 IM SUSP: 0.5000 mL | Freq: Once | INTRAMUSCULAR | Status: DC

## 2015-07-08 MED ORDER — OXYCODONE-ACETAMINOPHEN 5-325 MG PO TABS: 2.0000 | ORAL_TABLET | ORAL | Status: DC | PRN

## 2015-07-08 MED ORDER — ACETAMINOPHEN 325 MG PO TABS
650.0000 mg | ORAL_TABLET | ORAL | Status: DC | PRN
  Administered 2015-07-08 – 2015-07-10 (×5): 650 mg via ORAL
  Filled 2015-07-08 (×4): qty 2

## 2015-07-08 MED ORDER — ACETAMINOPHEN 325 MG PO TABS
650.0000 mg | ORAL_TABLET | ORAL | Status: DC | PRN
  Administered 2015-07-08: 650 mg via ORAL

## 2015-07-08 MED ORDER — ONDANSETRON HCL 4 MG/2ML IJ SOLN: 4.0000 mg | INTRAMUSCULAR | Status: DC | PRN

## 2015-07-08 MED ORDER — OXYTOCIN 40 UNITS IN LACTATED RINGERS INFUSION - SIMPLE MED: 62.5000 mL/h | INTRAVENOUS | Status: DC | PRN

## 2015-07-08 MED ORDER — LANOLIN HYDROUS EX OINT: TOPICAL_OINTMENT | CUTANEOUS | Status: DC | PRN

## 2015-07-08 MED ORDER — LACTATED RINGERS IV SOLN
INTRAVENOUS | Status: DC
  Administered 2015-07-09: 11:00:00 via INTRAVENOUS

## 2015-07-08 MED ORDER — METOCLOPRAMIDE HCL 10 MG PO TABS
10.0000 mg | ORAL_TABLET | Freq: Once | ORAL | Status: AC
Start: 2015-07-08 — End: 2015-07-09
  Administered 2015-07-09: 10 mg via ORAL
  Filled 2015-07-08 (×2): qty 1

## 2015-07-08 MED ORDER — WITCH HAZEL-GLYCERIN EX PADS: 1.0000 "application " | MEDICATED_PAD | CUTANEOUS | Status: DC | PRN

## 2015-07-08 MED ORDER — PRENATAL MULTIVITAMIN CH
1.0000 | ORAL_TABLET | Freq: Every day | ORAL | Status: DC
Start: 2015-07-08 — End: 2015-07-08

## 2015-07-08 MED ORDER — OXYTOCIN 40 UNITS IN LACTATED RINGERS INFUSION - SIMPLE MED
62.5000 mL/h | INTRAVENOUS | Status: DC
Start: 2015-07-08 — End: 2015-07-08

## 2015-07-08 MED ORDER — MAGNESIUM HYDROXIDE 400 MG/5ML PO SUSP: 30.0000 mL | ORAL | Status: DC | PRN

## 2015-07-08 MED ORDER — ZOLPIDEM TARTRATE 5 MG PO TABS: 5.0000 mg | ORAL_TABLET | Freq: Every evening | ORAL | Status: DC | PRN

## 2015-07-08 MED ORDER — CITRIC ACID-SODIUM CITRATE 334-500 MG/5ML PO SOLN
30.0000 mL | ORAL | Status: DC | PRN
Start: 2015-07-08 — End: 2015-07-10
  Administered 2015-07-09: 30 mL via ORAL
  Filled 2015-07-08: qty 15

## 2015-07-08 MED ORDER — ONDANSETRON HCL 4 MG PO TABS: 4.0000 mg | ORAL_TABLET | ORAL | Status: DC | PRN

## 2015-07-08 MED ORDER — LIDOCAINE HCL (PF) 1 % IJ SOLN
30.0000 mL | INTRAMUSCULAR | Status: DC | PRN
  Filled 2015-07-08: qty 30

## 2015-07-08 MED ORDER — LACTATED RINGERS IV SOLN: 500.0000 mL | INTRAVENOUS | Status: DC | PRN

## 2015-07-08 MED ORDER — PENICILLIN G POTASSIUM 5000000 UNITS IJ SOLR
2.5000 10*6.[IU] | INTRAVENOUS | Status: DC
  Filled 2015-07-08 (×2): qty 2.5

## 2015-07-08 MED ORDER — OXYTOCIN 10 UNIT/ML IJ SOLN
10.0000 [IU] | Freq: Once | INTRAMUSCULAR | Status: AC
  Administered 2015-07-08: 10 [IU] via INTRAMUSCULAR

## 2015-07-08 MED ORDER — BENZOCAINE-MENTHOL 20-0.5 % EX AERO: 1.0000 "application " | INHALATION_SPRAY | CUTANEOUS | Status: DC | PRN

## 2015-07-08 MED ORDER — DIBUCAINE 1 % RE OINT: 1.0000 "application " | TOPICAL_OINTMENT | RECTAL | Status: DC | PRN

## 2015-07-08 MED ORDER — METHYLERGONOVINE MALEATE 0.2 MG/ML IJ SOLN: 0.2000 mg | INTRAMUSCULAR | Status: DC | PRN

## 2015-07-08 MED ORDER — FAMOTIDINE 20 MG PO TABS
40.0000 mg | ORAL_TABLET | Freq: Once | ORAL | Status: AC
  Administered 2015-07-09: 40 mg via ORAL
  Filled 2015-07-08 (×2): qty 2

## 2015-07-08 MED ORDER — LACTATED RINGERS IV SOLN
INTRAVENOUS | Status: DC
  Administered 2015-07-09: 12:00:00 via INTRAVENOUS

## 2015-07-08 MED ORDER — OXYCODONE-ACETAMINOPHEN 5-325 MG PO TABS: 1.0000 | ORAL_TABLET | ORAL | Status: DC | PRN

## 2015-07-08 MED ORDER — OXYTOCIN BOLUS FROM INFUSION: 500.0000 mL | INTRAVENOUS | Status: DC

## 2015-07-08 MED ORDER — DEXTROSE 5 % IV SOLN
5.0000 10*6.[IU] | Freq: Once | INTRAVENOUS | Status: DC
Start: 2015-07-08 — End: 2015-07-08
  Filled 2015-07-08: qty 5

## 2015-07-08 MED ORDER — DEXTROSE IN LACTATED RINGERS 5 % IV SOLN
INTRAVENOUS | Status: DC
  Administered 2015-07-08: 06:00:00 via INTRAVENOUS

## 2015-07-08 MED ORDER — OXYCODONE-ACETAMINOPHEN 5-325 MG PO TABS
1.0000 | ORAL_TABLET | ORAL | Status: DC | PRN
  Administered 2015-07-10 (×2): 1 via ORAL
  Filled 2015-07-08 (×2): qty 1

## 2015-07-08 MED ORDER — SIMETHICONE 80 MG PO CHEW
80.0000 mg | CHEWABLE_TABLET | ORAL | Status: DC | PRN
  Administered 2015-07-09 – 2015-07-10 (×4): 80 mg via ORAL
  Filled 2015-07-08 (×4): qty 1

## 2015-07-08 MED ORDER — SENNOSIDES-DOCUSATE SODIUM 8.6-50 MG PO TABS
2.0000 | ORAL_TABLET | ORAL | Status: DC
  Administered 2015-07-08 – 2015-07-09 (×2): 2 via ORAL
  Filled 2015-07-08 (×3): qty 2

## 2015-07-08 MED ORDER — ACETAMINOPHEN 325 MG PO TABS
650.0000 mg | ORAL_TABLET | ORAL | Status: DC | PRN
  Administered 2015-07-08 (×2): 650 mg via ORAL
  Filled 2015-07-08 (×3): qty 2

## 2015-07-08 MED ORDER — FERROUS SULFATE 325 (65 FE) MG PO TABS
325.0000 mg | ORAL_TABLET | Freq: Two times a day (BID) | ORAL | Status: DC
  Administered 2015-07-08 – 2015-07-09 (×3): 325 mg via ORAL
  Filled 2015-07-08 (×6): qty 1

## 2015-07-08 MED ORDER — METHYLERGONOVINE MALEATE 0.2 MG PO TABS: 0.2000 mg | ORAL_TABLET | ORAL | Status: DC | PRN

## 2015-07-08 MED ORDER — DIPHENHYDRAMINE HCL 25 MG PO CAPS: 25.0000 mg | ORAL_CAPSULE | Freq: Four times a day (QID) | ORAL | Status: DC | PRN

## 2015-07-08 MED ORDER — SENNOSIDES-DOCUSATE SODIUM 8.6-50 MG PO TABS: 2.0000 | ORAL_TABLET | ORAL | Status: DC

## 2015-07-08 MED ORDER — ONDANSETRON HCL 4 MG/2ML IJ SOLN: 4.0000 mg | Freq: Four times a day (QID) | INTRAMUSCULAR | Status: DC | PRN

## 2015-07-08 MED ORDER — SIMETHICONE 80 MG PO CHEW: 80.0000 mg | CHEWABLE_TABLET | ORAL | Status: DC | PRN

## 2015-07-08 NOTE — MAU Note (Signed)
Pt reports painful uc's since 0030

## 2015-07-08 NOTE — H&P (Signed)
Kristin Payne is a 34 y.o. female G5 P3104 now s/p an uncomplicated vaginal delivery at 38 2/7 weeks. Pt states she had contractions yesterday and came in to r/o labor.  She was 2-3 cm.  Contractions spaced out and she was sent home.  Contractions awakened pt this am at 12 midnight ~ 4 hours prior to delivery.  Contractions were strong and rapid.  Pt denied LOF or VB. Upon arrival to MAU pt was in severe pain, cervical exam 3-4 cm.  Pt was completely dilated ~30 minutes later and had a strong urge to push.  Pt delivered in MAU without complication with Venus Standard, CNM in attendance. Pt currently is without complaints.  Desires postpartum BTL but would like to eat. Prenatal care with Dr. Nelda Marseille has been complicated by iron deficiency anemia, HSV 2 positive, GBS + per pt.  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  Denies prodromal symptoms.  She was not taking Valtrex due to fear of dizziness.  No vulvar lesions were seen per CNM.  Pt did not get Ampicillin prior to delivery.  Maternal Medical History:  Reason for admission: Contractions.   Contractions: Onset was 6-12 hours ago.   Frequency: regular.   Perceived severity is strong.    Fetal activity: Perceived fetal activity is normal.    Prenatal complications: Iron deficiency anemia.  Prenatal Complications - Diabetes: none.    OB History    Gravida Para Term Preterm AB TAB SAB Ectopic Multiple Living   5 4 3 1      4      Past Medical History  Diagnosis Date  . Anemia   . Allergy   . Plantar fasciitis   . Iron deficiency anemia 03/12/2015  . Anemia affecting pregnancy in second trimester, antepartum 03/12/2015   Past Surgical History  Procedure Laterality Date  . Cystectomy  2006    left  . Mouth surgery Left    Family History: family history includes Hypertension in her mother; Miscarriages / Stillbirths in her mother. Social History:  reports that she has never smoked. She has never used smokeless tobacco. She  reports that she does not drink alcohol or use illicit drugs.   Prenatal Transfer Tool  Maternal Diabetes: No Genetic Screening: Declined Maternal Ultrasounds/Referrals: Normal Fetal Ultrasounds or other Referrals:  None Maternal Substance Abuse:  No Significant Maternal Medications:  None Significant Maternal Lab Results:  Lab values include: Group B Strep positive Other Comments:  GBS+-untreated prior to delivery, HSV 2+, no lesions at delivery.  Review of Systems  Gastrointestinal: Positive for abdominal pain.    Dilation: 3.5 Effacement (%): 80 Station: -1, -2 Exam by:: B Mosca Blood pressure 106/49, pulse 87. Exam Physical Exam  Constitutional: She is oriented to person, place, and time. She appears well-developed and well-nourished. No distress.  HENT:  Head: Normocephalic and atraumatic.  Neck: Normal range of motion.  Respiratory: No respiratory distress.  GI:  Fundus firm, nontender.  Genitourinary:  Deferred.  Musculoskeletal: Normal range of motion. She exhibits no edema or tenderness.  Neurological: She is alert and oriented to person, place, and time.  Skin: Skin is warm and dry.  Psychiatric: She has a normal mood and affect.    Prenatal labs: ABO, Rh: --/--/O POS (11/16 [id number]) Antibody: NEG (11/16 0427) Rubella:   RPR:    HBsAg:    HIV:    GBS:     Assessment/Plan: S/p NSVD at 38 2/7 weeks.  Doing well. Desires PPBTL.  Plan for today possibly.  Discuss with Dr. Nelda Marseille.  NPO, D5LR.  Pt agreeable to wait. Transfer to postpartum unit. Routine postpartum care. Lactation consult.   Thurnell Lose 07/08/2015, 5:55 AM

## 2015-07-08 NOTE — Progress Notes (Signed)
I spoke to Dr. Nelda Marseille on the phone at 10:20 and she confirmed that they were cancelling the pts tubal for today at 69. Pt can eat, the surgery is rescheduled for 07/09/15 at 12:30. Pt is aware.

## 2015-07-08 NOTE — Lactation Note (Signed)
This note was copied from the chart of Kristin Kristin Payne. Lactation Consultation Note  Patient Name: Kristin Payne [id number] Date: 07/08/2015 Reason for consult: Initial assessment;Infant < 6lbs Visited with Mom and FOB, baby 85 hrs old.  This is Mom's 5th baby, for BTL tomorrow. Baby has had one 20 minute feeding that Mom stated was with a good latch.  Mom reports that her milk volume comes in early, and she got mastitis on day 3 with her 4th baby.  She then battled recurrent yeast infections, probably due to antibiotic use.  Mom is concerned about this happening again.  Reviewed engorgement prevention, and treatment.  Encouraged alternating positions of latch, and keeping baby skin to skin as much as possible.  GMOB was holding swaddled baby, and when she started to cry, LC assisted with putting baby skin to skin.  Mom manually expressed some colostrum from breast, but baby didn't latch nor root.  Left with baby lying on Mom's chest, skin to skin.  Encouraged feeding baby often on cue. Brochure left with Mom, and reminded her of IP and OP lactation services available to her.       Consult Status Consult Status: Follow-up Date: 07/09/15 Follow-up type: In-patient    Broadus John 07/08/2015, 12:04 PM

## 2015-07-09 ENCOUNTER — Inpatient Hospital Stay (HOSPITAL_COMMUNITY): Payer: 59 | Admitting: Anesthesiology

## 2015-07-09 ENCOUNTER — Encounter (HOSPITAL_COMMUNITY): Payer: Self-pay | Admitting: Certified Registered Nurse Anesthetist

## 2015-07-09 ENCOUNTER — Encounter (HOSPITAL_COMMUNITY)
Admission: AD | Disposition: A | Discharge status: Home / Self Care | Payer: Self-pay | Source: Ambulatory Visit | Attending: Obstetrics and Gynecology

## 2015-07-09 HISTORY — PX: TUBAL LIGATION: SHX77

## 2015-07-09 LAB — CBC
HEMATOCRIT: 31.5 % — AB (ref 36.0–46.0)
HEMOGLOBIN: 10.5 g/dL — AB (ref 12.0–15.0)
MCH: 27.3 pg (ref 26.0–34.0)
MCHC: 33.3 g/dL (ref 30.0–36.0)
MCV: 82 fL (ref 78.0–100.0)
Platelets: 142 10*3/uL — ABNORMAL LOW (ref 150–400)
RBC: 3.84 MIL/uL — AB (ref 3.87–5.11)
RDW: 18.4 % — ABNORMAL HIGH (ref 11.5–15.5)
WBC: 14.5 10*3/uL — ABNORMAL HIGH (ref 4.0–10.5)

## 2015-07-09 LAB — SURGICAL PCR SCREEN
MRSA, PCR: INVALID — AB
STAPHYLOCOCCUS AUREUS: INVALID — AB

## 2015-07-09 SURGERY — LIGATION, FALLOPIAN TUBE, POSTPARTUM
Anesthesia: General | Site: Abdomen | Laterality: Bilateral

## 2015-07-09 MED ORDER — FENTANYL CITRATE (PF) 250 MCG/5ML IJ SOLN
INTRAMUSCULAR | Status: AC
  Filled 2015-07-09: qty 5

## 2015-07-09 MED ORDER — FENTANYL CITRATE (PF) 100 MCG/2ML IJ SOLN
25.0000 ug | INTRAMUSCULAR | Status: DC | PRN
  Administered 2015-07-09 (×3): 50 ug via INTRAVENOUS

## 2015-07-09 MED ORDER — ONDANSETRON HCL 4 MG/2ML IJ SOLN
INTRAMUSCULAR | Status: AC
  Filled 2015-07-09: qty 2

## 2015-07-09 MED ORDER — MIDAZOLAM HCL 2 MG/2ML IJ SOLN
INTRAMUSCULAR | Status: DC | PRN
  Administered 2015-07-09: 2 mg via INTRAVENOUS

## 2015-07-09 MED ORDER — MEPERIDINE HCL 25 MG/ML IJ SOLN: 6.2500 mg | INTRAMUSCULAR | Status: DC | PRN

## 2015-07-09 MED ORDER — ROCURONIUM BROMIDE 100 MG/10ML IV SOLN
INTRAVENOUS | Status: AC
  Filled 2015-07-09: qty 1

## 2015-07-09 MED ORDER — PROPOFOL 10 MG/ML IV BOLUS
INTRAVENOUS | Status: DC | PRN
  Administered 2015-07-09: 150 mg via INTRAVENOUS

## 2015-07-09 MED ORDER — BUPIVACAINE HCL (PF) 0.25 % IJ SOLN
INTRAMUSCULAR | Status: AC
  Filled 2015-07-09: qty 30

## 2015-07-09 MED ORDER — KETOROLAC TROMETHAMINE 30 MG/ML IJ SOLN
INTRAMUSCULAR | Status: AC
  Filled 2015-07-09: qty 1

## 2015-07-09 MED ORDER — LIDOCAINE HCL (CARDIAC) 20 MG/ML IV SOLN
INTRAVENOUS | Status: DC | PRN
  Administered 2015-07-09: 60 mg via INTRAVENOUS

## 2015-07-09 MED ORDER — ONDANSETRON HCL 4 MG/2ML IJ SOLN
INTRAMUSCULAR | Status: DC | PRN
  Administered 2015-07-09: 4 mg via INTRAVENOUS

## 2015-07-09 MED ORDER — PHENYLEPHRINE HCL 10 MG/ML IJ SOLN
INTRAMUSCULAR | Status: DC | PRN
  Administered 2015-07-09: 40 ug via INTRAVENOUS

## 2015-07-09 MED ORDER — MIDAZOLAM HCL 2 MG/2ML IJ SOLN
INTRAMUSCULAR | Status: AC
  Filled 2015-07-09: qty 2

## 2015-07-09 MED ORDER — PHENYLEPHRINE 40 MCG/ML (10ML) SYRINGE FOR IV PUSH (FOR BLOOD PRESSURE SUPPORT)
PREFILLED_SYRINGE | INTRAVENOUS | Status: AC
  Filled 2015-07-09: qty 10

## 2015-07-09 MED ORDER — DEXAMETHASONE SODIUM PHOSPHATE 4 MG/ML IJ SOLN
INTRAMUSCULAR | Status: AC
  Filled 2015-07-09: qty 1

## 2015-07-09 MED ORDER — SUCCINYLCHOLINE CHLORIDE 20 MG/ML IJ SOLN
INTRAMUSCULAR | Status: AC
  Filled 2015-07-09: qty 1

## 2015-07-09 MED ORDER — BUPIVACAINE HCL (PF) 0.25 % IJ SOLN
INTRAMUSCULAR | Status: DC | PRN
  Administered 2015-07-09: 10 mL

## 2015-07-09 MED ORDER — SUCCINYLCHOLINE CHLORIDE 20 MG/ML IJ SOLN
INTRAMUSCULAR | Status: DC | PRN
  Administered 2015-07-09: 100 mg via INTRAVENOUS

## 2015-07-09 MED ORDER — DEXAMETHASONE SODIUM PHOSPHATE 10 MG/ML IJ SOLN
INTRAMUSCULAR | Status: DC | PRN
  Administered 2015-07-09: 4 mg via INTRAVENOUS

## 2015-07-09 MED ORDER — PROMETHAZINE HCL 25 MG/ML IJ SOLN: 6.2500 mg | INTRAMUSCULAR | Status: DC | PRN

## 2015-07-09 MED ORDER — PROPOFOL 10 MG/ML IV BOLUS
INTRAVENOUS | Status: AC
  Filled 2015-07-09: qty 20

## 2015-07-09 MED ORDER — FENTANYL CITRATE (PF) 100 MCG/2ML IJ SOLN
INTRAMUSCULAR | Status: AC
  Filled 2015-07-09: qty 2

## 2015-07-09 MED ORDER — LACTATED RINGERS IV SOLN: INTRAVENOUS | Status: DC

## 2015-07-09 MED ORDER — ROCURONIUM BROMIDE 100 MG/10ML IV SOLN
INTRAVENOUS | Status: DC | PRN
  Administered 2015-07-09: 5 mg via INTRAVENOUS

## 2015-07-09 MED ORDER — FENTANYL CITRATE (PF) 100 MCG/2ML IJ SOLN
INTRAMUSCULAR | Status: DC | PRN
  Administered 2015-07-09: 25 ug via INTRAVENOUS
  Administered 2015-07-09: 50 ug via INTRAVENOUS
  Administered 2015-07-09: 25 ug via INTRAVENOUS
  Administered 2015-07-09: 50 ug via INTRAVENOUS

## 2015-07-09 MED ORDER — LIDOCAINE HCL (CARDIAC) 20 MG/ML IV SOLN
INTRAVENOUS | Status: AC
  Filled 2015-07-09: qty 5

## 2015-07-09 SURGICAL SUPPLY — 33 items
BENZOIN TINCTURE PRP APPL 2/3 (GAUZE/BANDAGES/DRESSINGS) ×2 IMPLANT
CANISTER SUCT 3000ML (MISCELLANEOUS) ×2 IMPLANT
CATH ROBINSON RED A/P 16FR (CATHETERS) ×2 IMPLANT
CLOTH BEACON ORANGE TIMEOUT ST (SAFETY) ×2 IMPLANT
CONTAINER PREFILL 10% NBF 15ML (MISCELLANEOUS) ×4 IMPLANT
DRSG OPSITE POSTOP 3X4 (GAUZE/BANDAGES/DRESSINGS) ×2 IMPLANT
ELECT REM PT RETURN 9FT ADLT (ELECTROSURGICAL) ×2
ELECTRODE REM PT RTRN 9FT ADLT (ELECTROSURGICAL) ×1 IMPLANT
GLOVE BIO SURGEON STRL SZ7 (GLOVE) ×4 IMPLANT
GLOVE BIO SURGEON STRL SZ8 (GLOVE) ×2 IMPLANT
GLOVE BIOGEL PI IND STRL 6.5 (GLOVE) ×1 IMPLANT
GLOVE BIOGEL PI IND STRL 7.0 (GLOVE) ×1 IMPLANT
GLOVE BIOGEL PI INDICATOR 6.5 (GLOVE) ×1
GLOVE BIOGEL PI INDICATOR 7.0 (GLOVE) ×1
GLOVE ECLIPSE 6.5 STRL STRAW (GLOVE) ×2 IMPLANT
GOWN STRL REUS W/TWL LRG LVL3 (GOWN DISPOSABLE) ×4 IMPLANT
LIQUID BAND (GAUZE/BANDAGES/DRESSINGS) ×2 IMPLANT
NEEDLE HYPO 22GX1.5 SAFETY (NEEDLE) ×2 IMPLANT
NS IRRIG 1000ML POUR BTL (IV SOLUTION) ×2 IMPLANT
PACK ABDOMINAL MINOR (CUSTOM PROCEDURE TRAY) ×2 IMPLANT
PENCIL BUTTON HOLSTER BLD 10FT (ELECTRODE) ×2 IMPLANT
SPONGE LAP 4X18 X RAY DECT (DISPOSABLE) ×2 IMPLANT
STRIP CLOSURE SKIN 1/4X3 (GAUZE/BANDAGES/DRESSINGS) ×2 IMPLANT
SUT PLAIN 0 NONE (SUTURE) ×4 IMPLANT
SUT VIC AB 0 CT1 27 (SUTURE) ×1
SUT VIC AB 0 CT1 27XBRD ANBCTR (SUTURE) ×1 IMPLANT
SUT VICRYL 4-0 PS2 18IN ABS (SUTURE) ×2 IMPLANT
SYR CONTROL 10ML LL (SYRINGE) ×2 IMPLANT
TOWEL OR 17X24 6PK STRL BLUE (TOWEL DISPOSABLE) ×4 IMPLANT
TRAY FOLEY CATH SILVER 14FR (SET/KITS/TRAYS/PACK) IMPLANT
TUBING NON-CON 1/4 X 20 CONN (TUBING) ×2 IMPLANT
WATER STERILE IRR 1000ML POUR (IV SOLUTION) IMPLANT
YANKAUER SUCT BULB TIP NO VENT (SUCTIONS) ×2 IMPLANT

## 2015-07-09 NOTE — Progress Notes (Signed)
Postpartum Note Day # 1  S:  Patient resting comfortable in bed.  Pain controlled.  Tolerating general diet, NPO since midnight. Lochia appropriate.  Ambulating without difficulty.  She denies n/v/f/c, SOB, or CP.  Pt plans on breastfeeding.  O: Temp:  [98.3 F (36.8 C)-98.4 F (36.9 C)] 98.3 F (36.8 C) (11/17 0621) Pulse Rate:  [71-74] 72 (11/17 0621) Resp:  [18] 18 (11/17 0621) BP: (108-111)/(62-65) 108/65 mmHg (11/17 0621) SpO2:  [98 %-99 %] 99 % (11/17 0621) Gen: A&Ox3, NAD CV: RRR, no MRG Resp: CTAB Abdomen: soft, NT, ND Uterus: firm, non-tender, below umbilicus Ext: No edema, no calf tenderness bilaterally, SCDs in place  Labs:  Recent Labs  07/08/15 0427 07/09/15 0500  HGB 11.6* 10.5*    A/P: Pt is a 34 y.o. [id number] s/p NSVD, PPD#1  - Pain well controlled -GU: UOP is adequate -GI: Tolerating general diet -Activity: encouraged sitting up to chair and ambulation as tolerated -Prophylaxis: early ambulation -Labs: stable as above -Pt desires permanent sterilization- plan for bilateral salpingectomy today. R&B including but not limited to bleeding, infection, injury to other organs, and irreversibility.  The absence of effect on future cycle, PMS and menopause also discussed. Questions answered.   Janyth Pupa, DO [phone] (pager) [phone] (office)

## 2015-07-09 NOTE — Anesthesia Postprocedure Evaluation (Signed)
  Anesthesia Post-op Note  Patient: Kristin Payne  Procedure(s) Performed: Procedure(s) (LRB): BILATERAL SALPINGECTOMY (Bilateral)  Patient Location: PACU  Anesthesia Type: General  Level of Consciousness: awake and alert   Airway and Oxygen Therapy: Patient Spontanous Breathing  Post-op Pain: mild  Post-op Assessment: Post-op Vital signs reviewed, Patient's Cardiovascular Status Stable, Respiratory Function Stable, Patent Airway and No signs of Nausea or vomiting  Last Vitals:  Filed Vitals:   07/09/15 1415  BP: 119/72  Pulse: 73  Temp:   Resp: 15    Post-op Vital Signs: stable   Complications: No apparent anesthesia complications

## 2015-07-09 NOTE — Anesthesia Procedure Notes (Signed)
Procedure Name: Intubation Date/Time: 07/09/2015 12:39 PM Performed by: Raenette Rover Pre-anesthesia Checklist: Patient identified, Patient being monitored, Emergency Drugs available and Suction available Patient Re-evaluated:Patient Re-evaluated prior to inductionOxygen Delivery Method: Circle system utilized Preoxygenation: Pre-oxygenation with 100% oxygen Intubation Type: IV induction, Rapid sequence and Cricoid Pressure applied Laryngoscope Size: Mac and 3 Grade View: Grade I Tube type: Oral Tube size: 7.0 mm Number of attempts: 1 Airway Equipment and Method: Stylet Placement Confirmation: ETT inserted through vocal cords under direct vision,  breath sounds checked- equal and bilateral,  positive ETCO2 and CO2 detector Secured at: 22 cm Tube secured with: Tape Dental Injury: Teeth and Oropharynx as per pre-operative assessment

## 2015-07-09 NOTE — Anesthesia Preprocedure Evaluation (Addendum)
Anesthesia Evaluation  Patient identified by MRN, date of birth, ID band Patient awake    Reviewed: Allergy & Precautions, NPO status , Patient's Chart, lab work & pertinent test results  Airway Mallampati: I  TM Distance: >3 FB Neck ROM: Full    Dental  (+) Teeth Intact   Pulmonary neg pulmonary ROS,    breath sounds clear to auscultation       Cardiovascular negative cardio ROS   Rhythm:Regular Rate:Normal     Neuro/Psych negative neurological ROS  negative psych ROS   GI/Hepatic Neg liver ROS, GERD  Medicated,  Endo/Other  negative endocrine ROS  Renal/GU negative Renal ROS  negative genitourinary   Musculoskeletal negative musculoskeletal ROS (+)   Abdominal   Peds negative pediatric ROS (+)  Hematology   Anesthesia Other Findings   Reproductive/Obstetrics negative OB ROS                            Anesthesia Physical Anesthesia Plan  ASA: II  Anesthesia Plan: General   Post-op Pain Management:    Induction: Intravenous and Rapid sequence  Airway Management Planned: Oral ETT  Additional Equipment:   Intra-op Plan:   Post-operative Plan: Extubation in OR  Informed Consent: I have reviewed the patients History and Physical, chart, labs and discussed the procedure including the risks, benefits and alternatives for the proposed anesthesia with the patient or authorized representative who has indicated his/her understanding and acceptance.   Dental advisory given  Plan Discussed with: CRNA  Anesthesia Plan Comments:        Anesthesia Quick Evaluation

## 2015-07-09 NOTE — Lactation Note (Signed)
This note was copied from the chart of Kristin Payne. Lactation Consultation Note  Patient Name: Kristin Payne [id number] Date: 07/09/2015 Reason for consult: Follow-up assessment Mom having BTL, FOB wanting to supplement with formula via curved tipped syringe. Demonstrated to FOB how to give formula. FOB demonstrated back supplementing using curved tipped syringe. Will follow up with Mom later this evening.   Maternal Data    Feeding Feeding Type: Formula  LATCH Score/Interventions                      Lactation Tools Discussed/Used     Consult Status Consult Status: Follow-up Date: 07/09/15 Follow-up type: In-patient    Katrine Coho 07/09/2015, 3:17 PM

## 2015-07-09 NOTE — Op Note (Signed)
Preop diagnosis: Desire for sterilization  Postop diagnosis: Same  Anesthesia: Epidural  Procedure: Postpartum bilateral tubal ligation  Surgeon: Dr. Nelda Marseille  Estimated blood loss: 15cc IVF: 800cc  Procedure: Preop diagnosis: Desire for sterilization  Postop diagnosis: Same  Anesthesia: Epidural  Procedure: Postpartum bilateral salpingectomy Surgeon: Dr. Nelda Marseille  Asst.: None  Procedure:  Patient was taken to the O.R. where epidural anesthesia was used without any complication. She was placed in the dorsal decubitus position, prepped and draped in a sterile fashion. A Foley catheter was inserted.  A subumbilical incision was made with the scalpel and the Mayo scissors were used down to the fascia. The fascia was identified and entered with Mayo scissors. Using Trendelenburg position, a moist tagged packing was placed.  Attention was turned to the right side, where the right fallopian tube was  grasped with Babcock forceps and exteriorized until the fimbria was visualized.  The fallopian tube was serially ligated with free ties of 0 plain catgut.  Excellent hemostasis was noted.  Attention was turned to the left side and an identical procedure was carried out on the opposite side. Again hemostasis was adequate. The fascia and the anterior peritoneum were closed using a running suture of 0 Vicryl. The skin was reapproximated using a subcuticular suture of 4-0 Monocryl. Sponge, needle, and instrument counts were correct. The procedure is well tolerated by the patient who is taken to recovery room in a well and stable condition.  Specimen: bilateral fallopian tubes  Janyth Pupa, DO [phone] (pager) [phone] (office)

## 2015-07-09 NOTE — Transfer of Care (Signed)
Immediate Anesthesia Transfer of Care Note  Patient: Kristin Payne  Procedure(s) Performed: Procedure(s): BILATERAL SALPINGECTOMY (Bilateral)  Patient Location: PACU  Anesthesia Type:General  Level of Consciousness: awake, alert , oriented and patient cooperative  Airway & Oxygen Therapy: Patient Spontanous Breathing and Patient connected to nasal cannula oxygen  Post-op Assessment: Report given to RN and Post -op Vital signs reviewed and stable  Post vital signs: Reviewed and stable  Last Vitals:  Filed Vitals:   07/09/15 0621  BP: 108/65  Pulse: 72  Temp: 36.8 C  Resp: 18    Complications: No apparent anesthesia complications

## 2015-07-10 ENCOUNTER — Encounter (HOSPITAL_COMMUNITY): Payer: Self-pay | Admitting: Obstetrics & Gynecology

## 2015-07-10 MED ORDER — OXYCODONE-ACETAMINOPHEN 5-325 MG PO TABS: 1.0000 | ORAL_TABLET | Freq: Four times a day (QID) | ORAL | Status: AC | PRN

## 2015-07-10 MED ORDER — DOCUSATE SODIUM 100 MG PO CAPS: 100.0000 mg | ORAL_CAPSULE | Freq: Two times a day (BID) | ORAL | Status: AC

## 2015-07-10 NOTE — Lactation Note (Signed)
This note was copied from the chart of Kristin Payne. Lactation Consultation Note  Patient Name: Kristin Payne [id number] Date: 07/10/2015 Reason for consult: Follow-up assessment Mom's breast are filling. [name] assisted Mom with obtaining more depth with latch. Some intermittent dimpling noted with nursing, Mom denies any discomfort and nipple was round when baby came off the breast. Baby has wide gape at breast with well flanged lips. Demonstrates some good suckling bursts with swallowing motions noted. Sleepy at this feeding but responds well to breast compression to keep nursing. Stressed importance of good depth to milk transfer. Mom history of Mastitis and yeast with last child. Demonstrated/reviewed massage with nursing to empty breast well, engorgement care reviewed if needed. Baby nursing frequently, encouraged to BF both breasts when possible 15-30 minutes. Baby at 4% weight loss, 1 void/1 stool in past 24 hours. 2 voids/6 stools in 54 hours. Demonstrated hand pump to Mom to use if needed for engorgement and advised to give any EBM she receives back to baby. Advised to monitor voids/stools. Advised to refer to Baby N Me booklet pages 24-25 for engorgement care, output chart and breast milk storage guidelines. Advised of OP services and support group.   Maternal Data    Feeding Feeding Type: Breast Fed Length of feed: 20 min  LATCH Score/Interventions Latch: Grasps breast easily, tongue down, lips flanged, rhythmical sucking. Intervention(s): Adjust position;Assist with latch;Breast massage;Breast compression  Audible Swallowing: A few with stimulation  Type of Nipple: Everted at rest and after stimulation  Comfort (Breast/Nipple): Filling, red/small blisters or bruises, mild/mod discomfort  Problem noted: Mild/Moderate discomfort  Hold (Positioning): Assistance needed to correctly position infant at breast and maintain latch. Intervention(s): Breastfeeding basics  reviewed;Support Pillows;Position options  LATCH Score: 7  Lactation Tools Discussed/Used Tools: Pump Breast pump type: Manual   Consult Status Consult Status: Complete Date: 07/10/15 Follow-up type: In-patient    Katrine Coho 07/10/2015, 11:18 AM

## 2015-07-10 NOTE — Progress Notes (Signed)
Postpartum Note Day # 2, s/p bilateral salpingectomy Day #1  S:  Patient resting comfortable in bed.  Pain controlled.  Tolerating general diet.  Lochia appropriate.  Ambulating without difficulty.  +flatus, +BM.  She denies n/v/f/c, SOB, or CP.  Pt plans on breastfeeding.  O: Temp:  [98.1 F (36.7 C)-98.4 F (36.9 C)] 98.3 F (36.8 C) (11/18 0356) Pulse Rate:  [66-97] 74 (11/18 0356) Resp:  [13-27] 16 (11/18 0356) BP: (103-128)/(62-89) 103/70 mmHg (11/18 0356) SpO2:  [96 %-100 %] 100 % (11/18 0356) Gen: A&Ox3, NAD CV: RRR, no MRG Resp: CTAB Abdomen: soft, NT, ND Honeycomb dressing on umbilicus- C/D/I Uterus: firm, non-tender, below umbilicus Ext: No edema, no calf tenderness bilaterally Labs:   Recent Labs  07/08/15 0427 07/09/15 0500  HGB 11.6* 10.5*    A/P: Pt is a 34 y.o. [id number] s/p NSVD, PPD#2, s/p bilateral salpingectomy Day #1  - Pain well controlled -GU: UOP is adequate -GI: Tolerating general diet -Activity: encouraged sitting up to chair and ambulation as tolerated -Prophylaxis: early ambulation -Labs: stable as above -Pt meeting postpartum milestones appropriately, plan for discharge home today.  Janyth Pupa, DO [phone] (pager) [phone] (office)

## 2015-07-10 NOTE — Discharge Instructions (Signed)
Postpartum Tubal Removal, Care After Refer to this sheet in the next few weeks. These instructions provide you with information about caring for yourself after your procedure. Your health care provider may also give you more specific instructions. Your treatment has been planned according to current medical practices, but problems sometimes occur. Call your health care provider if you have any problems or questions after your procedure. WHAT TO EXPECT AFTER THE PROCEDURE After your procedure, it is common to have:  Sore throat.  Soreness at the incision site.  Mild cramping.  Tiredness.  Mild nausea or vomiting. HOME CARE INSTRUCTIONS  Rest for the remainder of the day.  Take medicines only as directed by your health care provider. For pain management you may take percocet as needed.  This may cause constipation, so please continue Colace twice daily.  These include over-the-counter medicines and prescription medicines. Do not take aspirin, which can cause bleeding.  Over the next few days, gradually return to your normal activities and your normal diet.  Avoid sexual intercourse for 2 weeks or as directed by your health care provider.  Do not drive or operate heavy machinery while taking pain medicine.  Do not lift anything that is heavier than 5 lb (2.3 kg) for 2 weeks or as directed by your health care provider.  Do not take baths. Take showers only. Ask your health care provider when you can start taking baths.  Take your temperature twice each day and write it down.  Try to have help for the first 7-10 days for your household needs.  There are many different ways to close and cover an incision, including stitches (sutures), skin glue, and adhesive strips. Follow instructions from your health care provider about:  Incision care.  Bandage (dressing) changes and removal.  Incision closure removal.  Check your incision area every day for signs of infection. Watch  for:  Redness, swelling, or pain.  Fluid, blood, or pus.  Keep all follow-up visits as directed by your health care provider. SEEK MEDICAL CARE IF:  You have redness, swelling, or increasing pain in your incision area.  You have fluid or pus coming from your incision for longer than 1 day.  You notice a bad smell coming from your incision or your dressing.  The edges of your incision break open after the sutures have been removed.  Your pain does not decrease after 2-3 days.  You have a rash.  You repeatedly become dizzy or light-headed.  You have a reaction to your medicine.  Your pain medicine is not helping.  You are constipated. SEEK IMMEDIATE MEDICAL CARE IF:   You have a fever.  You faint.  You have increasing pain in your abdomen.  You have bleeding or drainage from your suture sites or your vagina after surgery.  You have shortness of breath or have difficulty breathing.  You have chest pain or leg pain.  You have ongoing nausea, vomiting, or diarrhea.   This information is not intended to replace advice given to you by your health care provider. Make sure you discuss any questions you have with your health care provider.   Document Released: 02/07/2012 Document Revised: 12/23/2014 Document Reviewed: 02/07/2012 Elsevier Interactive Patient Education 2016 [place]. Postpartum Care After Vaginal Delivery After you deliver your newborn (postpartum period), the usual stay in the hospital is 24-72 hours. If there were problems with your labor or delivery, or if you have other medical problems, you might be in the hospital longer.  While you are in the hospital, you will receive help and instructions on how to care for yourself and your newborn during the postpartum period.  While you are in the hospital:  Be sure to tell your nurses if you have pain or discomfort, as well as where you feel the pain and what makes the pain worse.  If you had an incision  made near your vagina (episiotomy) or if you had some tearing during delivery, the nurses may put ice packs on your episiotomy or tear. The ice packs may help to reduce the pain and swelling.  If you are breastfeeding, you may feel uncomfortable contractions of your uterus for a couple of weeks. This is normal. The contractions help your uterus get back to normal size.  It is normal to have some bleeding after delivery.  For the first 1-3 days after delivery, the flow is red and the amount may be similar to a period.  It is common for the flow to start and stop.  In the first few days, you may pass some small clots. Let your nurses know if you begin to pass large clots or your flow increases.  Do not  flush blood clots down the toilet before having the nurse look at them.  During the next 3-10 days after delivery, your flow should become more watery and pink or brown-tinged in color.  Ten to fourteen days after delivery, your flow should be a small amount of yellowish-white discharge.  The amount of your flow will decrease over the first few weeks after delivery. Your flow may stop in 6-8 weeks. Most women have had their flow stop by 12 weeks after delivery.  You should change your sanitary pads frequently.  Wash your hands thoroughly with soap and water for at least 20 seconds after changing pads, using the toilet, or before holding or feeding your newborn.  You should feel like you need to empty your bladder within the first 6-8 hours after delivery.  In case you become weak, lightheaded, or faint, call your nurse before you get out of bed for the first time and before you take a shower for the first time.  Within the first few days after delivery, your breasts may begin to feel tender and full. This is called engorgement. Breast tenderness usually goes away within 48-72 hours after engorgement occurs. You may also notice milk leaking from your breasts. If you are not breastfeeding, do  not stimulate your breasts. Breast stimulation can make your breasts produce more milk.  Spending as much time as possible with your newborn is very important. During this time, you and your newborn can feel close and get to know each other. Having your newborn stay in your room (rooming in) will help to strengthen the bond with your newborn. It will give you time to get to know your newborn and become comfortable caring for your newborn.  Your hormones change after delivery. Sometimes the hormone changes can temporarily cause you to feel sad or tearful. These feelings should not last more than a few days. If these feelings last longer than that, you should talk to your caregiver.  If desired, talk to your caregiver about methods of family planning or contraception.  Talk to your caregiver about immunizations. Your caregiver may want you to have the following immunizations before leaving the hospital:  Tetanus, diphtheria, and pertussis (Tdap) or tetanus and diphtheria (Td) immunization. It is very important that you and your family (including grandparents)  or others caring for your newborn are up-to-date with the Tdap or Td immunizations. The Tdap or Td immunization can help protect your newborn from getting ill.  Rubella immunization.  Varicella (chickenpox) immunization.  Influenza immunization. You should receive this annual immunization if you did not receive the immunization during your pregnancy.   This information is not intended to replace advice given to you by your health care provider. Make sure you discuss any questions you have with your health care provider.   Document Released: 06/05/2007 Document Revised: 05/02/2012 Document Reviewed: 04/04/2012 Elsevier Interactive Patient Education [employer].

## 2015-07-10 NOTE — Lactation Note (Signed)
This note was copied from the chart of Kristin Emeline Piekarski. Lactation Consultation Note  P5, Mother called for help stating her hand pump was not working because she was not spraying when she pumped. Discussed with mother that it is a little early to be spraying but it is a good sign that she had pump a few ml of breastmilk. Her breasts are filling and she has been applying warmth and states it has not helped. Recommend she lie flat in bed and massage breasts toward collarbone and axillae.  Then apply ice packs for 10-15 every 3 hours. Suggest she call if she needs further assistance.  Patient Name: Kristin Payne [id number] Date: 07/10/2015 Reason for consult: Follow-up assessment   Maternal Data    Feeding Feeding Type: Breast Fed Length of feed: 20 min  LATCH Score/Interventions Latch: Grasps breast easily, tongue down, lips flanged, rhythmical sucking. Intervention(s): Adjust position;Assist with latch;Breast massage;Breast compression  Audible Swallowing: A few with stimulation  Type of Nipple: Everted at rest and after stimulation  Comfort (Breast/Nipple): Filling, red/small blisters or bruises, mild/mod discomfort  Problem noted: Mild/Moderate discomfort  Hold (Positioning): Assistance needed to correctly position infant at breast and maintain latch. Intervention(s): Breastfeeding basics reviewed;Support Pillows;Position options  LATCH Score: 7  Lactation Tools Discussed/Used Tools: Pump Breast pump type: Manual   Consult Status Consult Status: Complete Date: 07/10/15 Follow-up type: In-patient    Kristin Payne [facility] 07/10/2015, 12:39 PM

## 2015-07-11 LAB — MRSA CULTURE

## 2015-07-11 NOTE — Discharge Summary (Signed)
OB Discharge Summary     Patient Name: Kristin Payne DOB: [birth year] MRN: [id number]  Date of admission: 07/08/2015 Delivering MD: Sallee Provencal   Date of discharge: 07/10/2015  Admitting diagnosis: 38 WEEKS CTX desires sterilization Intrauterine pregnancy: [redacted]w[redacted]d     Secondary diagnosis:  Active Problems:   Active labor at term   Normal vaginal delivery Anemia in pregnancy History of HSV Desire for permanent sterilization  Additional problems: none     Discharge diagnosis: Term Pregnancy Delivered and Anemia                                                                                                Post partum procedures: Postpartum bilateral salpingectomy  Augmentation: none  Complications: None  Hospital course:  Onset of Labor With Vaginal Delivery     34 y.o. yo [id number] at [redacted]w[redacted]d was admitted in Active Laboron 07/08/2015. Patient had an uncomplicated labor course as follows: Kristin Payne is a 34 y.o. female G5 P3104 at 25 2/7 weeks.  Contractions awakened pt at 12 midnight ~ 4 hours prior to delivery. Contractions were strong and rapid. Upon arrival to MAU pt was in severe pain, cervical exam 3-4 cm. Pt was completely dilated ~30 minutes later and had a strong urge to push. Pt delivered in MAU without complication with Venus Standard, CNM in attendance. Prenatal care with Dr. Nelda Marseille has been complicated by iron deficiency anemia, HSV 2 positive, GBS + per pt. 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 Denies prodromal symptoms. She was not taking Valtrex due to fear of dizziness. No vulvar lesions were seen per CNM. Pt did not get Ampicillin prior to delivery.  Membrane Rupture Time/Date: 4:19 AM ,07/08/2015   Intrapartum Procedures: Episiotomy: None [1]                                         Lacerations:     Patient had a delivery of a Viable infant. 07/08/2015  Information for the patient's newborn:  Falcon, Casale [id number]  Delivery  Method: Vag-Spont   Pateint had an uncomplicated postpartum course.  Postpartum bilateral salpingectomy was performed on PPD#1 by Dr. Nelda Marseille.  For information regarding procedure, please see operative report.  She is ambulating, tolerating a regular diet, passing flatus, and urinating well. Patient is discharged home in stable condition on 07/10/2015  2:43 PM.    Physical exam  Filed Vitals:   07/09/15 2015 07/10/15 0014 07/10/15 0356 07/10/15 0800  BP: 116/62 103/64 103/70 121/61  Pulse: 70 66 74 87  Temp: 98.1 F (36.7 C) 98.3 F (36.8 C) 98.3 F (36.8 C) 98.3 F (36.8 C)  TempSrc: Oral Oral Oral Oral  Resp: 18 16 16 16   SpO2:  99% 100%    General: alert, cooperative and no distress Lochia: appropriate Uterine Fundus: firm, non-tender below umbilicus Incision: C/D/I with honeycomb DVT Evaluation: No evidence of DVT seen on physical exam. Labs: Lab Results  Component Value Date   WBC 14.5* 07/09/2015  HGB 10.5* 07/09/2015   HCT 31.5* 07/09/2015   MCV 82.0 07/09/2015   PLT 142* 07/09/2015   No flowsheet data found.  Discharge instruction: per After Visit Summary and "Baby and Me Booklet".  After visit meds:    Medication List    TAKE these medications        B-COMPLEX/B-12 PO  Take 1,000 mg by mouth daily.     [employer] PRENATAL GUMMY PO  Take 2 tablets by mouth at bedtime. Happy mama gummies     docusate sodium 100 MG capsule  Commonly known as:  COLACE  Take 1 capsule (100 mg total) by mouth 2 (two) times daily.     FOLIC ACID PO  Take 1 tablet by mouth daily.     oxyCODONE-acetaminophen 5-325 MG tablet  Commonly known as:  PERCOCET/ROXICET  Take 1 tablet by mouth every 6 (six) hours as needed (for pain scale 4-7).     ZANTAC PO  Take 1 tablet by mouth daily as needed.        Diet: routine diet  Activity: Advance as tolerated. Pelvic rest for 6 weeks.   Outpatient follow up:2 weeks Follow up Appt:No future appointments. Follow up Visit:No Follow-up  on file.  Postpartum contraception: Tubal Ligation  Newborn Data: Live born female  Birth Weight: 5 lb 15.2 oz (2700 g) APGAR: 8, 9  Baby Feeding: Breast Disposition:home with mother   07/11/2015 Janyth Pupa, M, DO

## 2016-10-13 DIAGNOSIS — M25561 Pain in right knee: Secondary | ICD-10-CM | POA: Diagnosis not present

## 2016-11-14 DIAGNOSIS — J018 Other acute sinusitis: Secondary | ICD-10-CM | POA: Diagnosis not present

## 2016-11-14 DIAGNOSIS — J069 Acute upper respiratory infection, unspecified: Secondary | ICD-10-CM | POA: Diagnosis not present

## 2017-03-07 DIAGNOSIS — L2084 Intrinsic (allergic) eczema: Secondary | ICD-10-CM | POA: Diagnosis not present

## 2017-03-07 DIAGNOSIS — D649 Anemia, unspecified: Secondary | ICD-10-CM | POA: Diagnosis not present

## 2017-03-07 DIAGNOSIS — Z136 Encounter for screening for cardiovascular disorders: Secondary | ICD-10-CM | POA: Diagnosis not present

## 2017-03-07 DIAGNOSIS — J3089 Other allergic rhinitis: Secondary | ICD-10-CM | POA: Diagnosis not present

## 2017-07-20 DIAGNOSIS — D500 Iron deficiency anemia secondary to blood loss (chronic): Secondary | ICD-10-CM | POA: Diagnosis not present

## 2017-07-20 DIAGNOSIS — N920 Excessive and frequent menstruation with regular cycle: Secondary | ICD-10-CM | POA: Diagnosis not present

## 2017-08-08 DIAGNOSIS — N920 Excessive and frequent menstruation with regular cycle: Secondary | ICD-10-CM | POA: Diagnosis not present

## 2017-08-08 DIAGNOSIS — D500 Iron deficiency anemia secondary to blood loss (chronic): Secondary | ICD-10-CM | POA: Diagnosis not present

## 2017-10-27 ENCOUNTER — Other Ambulatory Visit: Payer: Self-pay | Admitting: Obstetrics & Gynecology

## 2017-10-27 DIAGNOSIS — Z3202 Encounter for pregnancy test, result negative: Secondary | ICD-10-CM | POA: Diagnosis not present

## 2017-10-27 DIAGNOSIS — N858 Other specified noninflammatory disorders of uterus: Secondary | ICD-10-CM | POA: Diagnosis not present

## 2017-10-27 DIAGNOSIS — D260 Other benign neoplasm of cervix uteri: Secondary | ICD-10-CM | POA: Diagnosis not present

## 2017-11-27 DIAGNOSIS — Z0100 Encounter for examination of eyes and vision without abnormal findings: Secondary | ICD-10-CM | POA: Diagnosis not present

## 2018-01-11 DIAGNOSIS — Z3202 Encounter for pregnancy test, result negative: Secondary | ICD-10-CM | POA: Diagnosis not present

## 2018-01-11 DIAGNOSIS — N920 Excessive and frequent menstruation with regular cycle: Secondary | ICD-10-CM | POA: Diagnosis not present

## 2018-03-12 DIAGNOSIS — Z23 Encounter for immunization: Secondary | ICD-10-CM | POA: Diagnosis not present

## 2018-03-12 DIAGNOSIS — D649 Anemia, unspecified: Secondary | ICD-10-CM | POA: Diagnosis not present

## 2018-03-12 DIAGNOSIS — D500 Iron deficiency anemia secondary to blood loss (chronic): Secondary | ICD-10-CM | POA: Diagnosis not present

## 2018-04-16 DIAGNOSIS — D500 Iron deficiency anemia secondary to blood loss (chronic): Secondary | ICD-10-CM | POA: Diagnosis not present

## 2018-04-16 DIAGNOSIS — N920 Excessive and frequent menstruation with regular cycle: Secondary | ICD-10-CM | POA: Diagnosis not present

## 2018-04-16 DIAGNOSIS — Z9889 Other specified postprocedural states: Secondary | ICD-10-CM | POA: Diagnosis not present

## 2018-08-13 DIAGNOSIS — N898 Other specified noninflammatory disorders of vagina: Secondary | ICD-10-CM | POA: Diagnosis not present

## 2018-08-13 DIAGNOSIS — Z9889 Other specified postprocedural states: Secondary | ICD-10-CM | POA: Diagnosis not present

## 2018-08-13 DIAGNOSIS — Z01411 Encounter for gynecological examination (general) (routine) with abnormal findings: Secondary | ICD-10-CM | POA: Diagnosis not present

## 2018-08-13 DIAGNOSIS — D500 Iron deficiency anemia secondary to blood loss (chronic): Secondary | ICD-10-CM | POA: Diagnosis not present

## 2019-06-18 ENCOUNTER — Other Ambulatory Visit: Payer: Self-pay

## 2019-06-18 DIAGNOSIS — Z20822 Contact with and (suspected) exposure to covid-19: Secondary | ICD-10-CM

## 2019-06-19 LAB — NOVEL CORONAVIRUS, NAA: SARS-CoV-2, NAA: NOT DETECTED

## 2020-05-04 DIAGNOSIS — Z20828 Contact with and (suspected) exposure to other viral communicable diseases: Secondary | ICD-10-CM | POA: Diagnosis not present

## 2020-08-24 DIAGNOSIS — Z124 Encounter for screening for malignant neoplasm of cervix: Secondary | ICD-10-CM | POA: Diagnosis not present

## 2020-08-24 DIAGNOSIS — Z01419 Encounter for gynecological examination (general) (routine) without abnormal findings: Secondary | ICD-10-CM | POA: Diagnosis not present

## 2020-09-14 ENCOUNTER — Other Ambulatory Visit: Payer: Self-pay

## 2020-09-14 DIAGNOSIS — Z20822 Contact with and (suspected) exposure to covid-19: Secondary | ICD-10-CM | POA: Diagnosis not present

## 2020-09-15 LAB — SARS-COV-2, NAA 2 DAY TAT

## 2020-09-15 LAB — NOVEL CORONAVIRUS, NAA: SARS-CoV-2, NAA: NOT DETECTED

## 2020-10-01 DIAGNOSIS — Z0000 Encounter for general adult medical examination without abnormal findings: Secondary | ICD-10-CM | POA: Diagnosis not present

## 2020-10-01 DIAGNOSIS — Z13220 Encounter for screening for lipoid disorders: Secondary | ICD-10-CM | POA: Diagnosis not present

## 2020-12-03 DIAGNOSIS — K2100 Gastro-esophageal reflux disease with esophagitis, without bleeding: Secondary | ICD-10-CM | POA: Diagnosis not present

## 2021-01-14 ENCOUNTER — Other Ambulatory Visit: Payer: Self-pay | Admitting: Obstetrics and Gynecology

## 2021-01-14 DIAGNOSIS — Z1231 Encounter for screening mammogram for malignant neoplasm of breast: Secondary | ICD-10-CM

## 2021-03-03 DIAGNOSIS — Z862 Personal history of diseases of the blood and blood-forming organs and certain disorders involving the immune mechanism: Secondary | ICD-10-CM | POA: Diagnosis not present

## 2021-03-03 DIAGNOSIS — K2100 Gastro-esophageal reflux disease with esophagitis, without bleeding: Secondary | ICD-10-CM | POA: Diagnosis not present

## 2021-03-10 DIAGNOSIS — Z20822 Contact with and (suspected) exposure to covid-19: Secondary | ICD-10-CM | POA: Diagnosis not present

## 2021-03-15 ENCOUNTER — Ambulatory Visit: Payer: Medicaid Other

## 2021-05-04 ENCOUNTER — Other Ambulatory Visit: Payer: Self-pay

## 2021-05-04 ENCOUNTER — Ambulatory Visit
Admission: RE | Admit: 2021-05-04 | Discharge: 2021-05-04 | Disposition: A | Discharge status: Home / Self Care | Payer: BC Managed Care – PPO | Source: Ambulatory Visit | Attending: Obstetrics and Gynecology | Admitting: Obstetrics and Gynecology

## 2021-05-04 DIAGNOSIS — Z1231 Encounter for screening mammogram for malignant neoplasm of breast: Secondary | ICD-10-CM | POA: Diagnosis not present

## 2021-09-09 DIAGNOSIS — Z124 Encounter for screening for malignant neoplasm of cervix: Secondary | ICD-10-CM | POA: Diagnosis not present

## 2021-09-09 DIAGNOSIS — Z01419 Encounter for gynecological examination (general) (routine) without abnormal findings: Secondary | ICD-10-CM | POA: Diagnosis not present

## 2021-09-09 DIAGNOSIS — N898 Other specified noninflammatory disorders of vagina: Secondary | ICD-10-CM | POA: Diagnosis not present

## 2021-10-14 DIAGNOSIS — Z13220 Encounter for screening for lipoid disorders: Secondary | ICD-10-CM | POA: Diagnosis not present

## 2021-10-14 DIAGNOSIS — Z0000 Encounter for general adult medical examination without abnormal findings: Secondary | ICD-10-CM | POA: Diagnosis not present

## 2022-03-23 ENCOUNTER — Other Ambulatory Visit: Payer: Self-pay | Admitting: Obstetrics and Gynecology

## 2022-03-23 DIAGNOSIS — Z1231 Encounter for screening mammogram for malignant neoplasm of breast: Secondary | ICD-10-CM

## 2022-05-09 ENCOUNTER — Ambulatory Visit: Payer: BC Managed Care – PPO

## 2022-05-10 ENCOUNTER — Ambulatory Visit
Admission: RE | Admit: 2022-05-10 | Discharge: 2022-05-10 | Disposition: A | Discharge status: Home / Self Care | Payer: BC Managed Care – PPO | Source: Ambulatory Visit | Attending: Obstetrics and Gynecology | Admitting: Obstetrics and Gynecology

## 2022-05-10 DIAGNOSIS — Z1231 Encounter for screening mammogram for malignant neoplasm of breast: Secondary | ICD-10-CM | POA: Diagnosis not present

## 2022-05-12 ENCOUNTER — Other Ambulatory Visit: Payer: Self-pay | Admitting: Obstetrics and Gynecology

## 2022-05-12 DIAGNOSIS — R928 Other abnormal and inconclusive findings on diagnostic imaging of breast: Secondary | ICD-10-CM

## 2022-05-20 ENCOUNTER — Ambulatory Visit
Admission: RE | Admit: 2022-05-20 | Discharge: 2022-05-20 | Disposition: A | Discharge status: Home / Self Care | Payer: BC Managed Care – PPO | Source: Ambulatory Visit | Attending: Obstetrics and Gynecology | Admitting: Obstetrics and Gynecology

## 2022-05-20 DIAGNOSIS — R928 Other abnormal and inconclusive findings on diagnostic imaging of breast: Secondary | ICD-10-CM

## 2022-05-20 DIAGNOSIS — N898 Other specified noninflammatory disorders of vagina: Secondary | ICD-10-CM | POA: Diagnosis not present

## 2022-05-20 DIAGNOSIS — N760 Acute vaginitis: Secondary | ICD-10-CM | POA: Diagnosis not present

## 2022-05-20 DIAGNOSIS — N6002 Solitary cyst of left breast: Secondary | ICD-10-CM | POA: Diagnosis not present

## 2022-09-14 IMAGING — MG MM DIGITAL SCREENING BILAT W/ TOMO AND CAD
8 series · 9 of 24 positions shown · non-contrast
Comparison: Previous exam(s).

CLINICAL DATA: Screening.

EXAM:
DIGITAL SCREENING BILATERAL MAMMOGRAM WITH TOMOSYNTHESIS AND CAD
TECHNIQUE: Bilateral screening digital craniocaudal and mediolateral oblique
mammograms were obtained. Bilateral screening digital breast
tomosynthesis was performed. The images were evaluated with
computer-aided detection.

[L CC synth-2D]
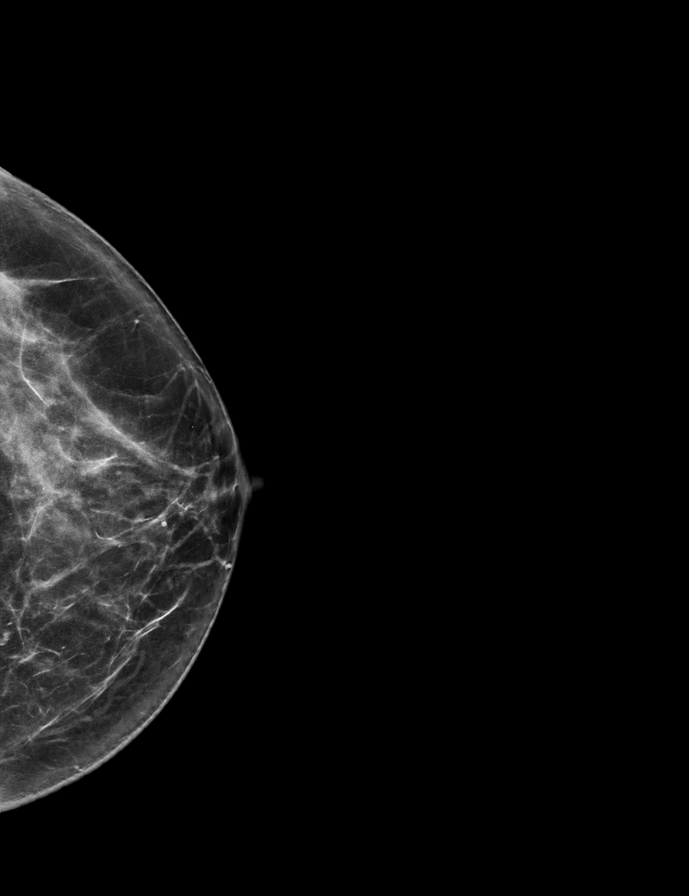

[L MLO synth-2D]
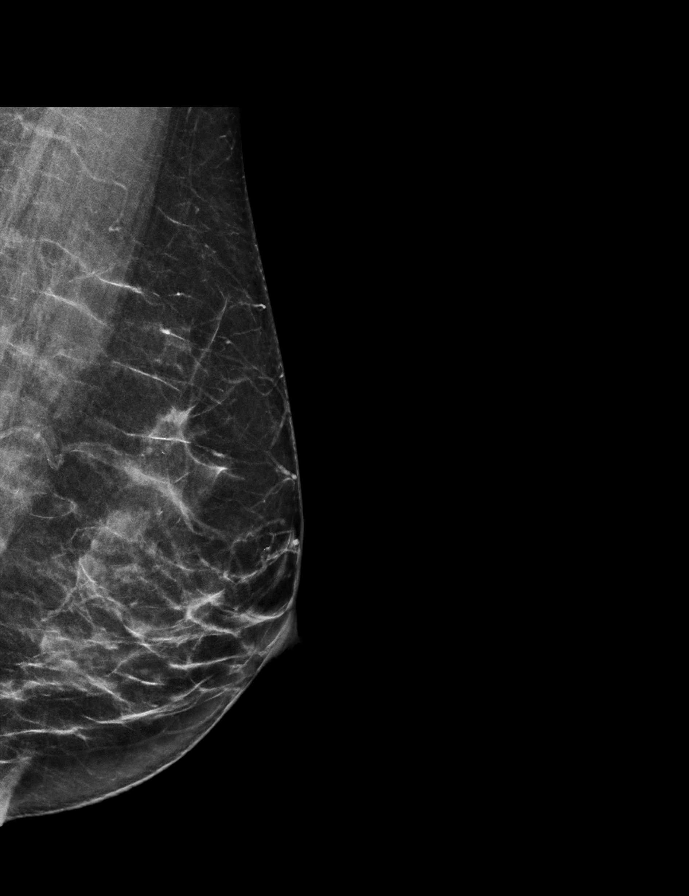

[R MLO synth-2D]
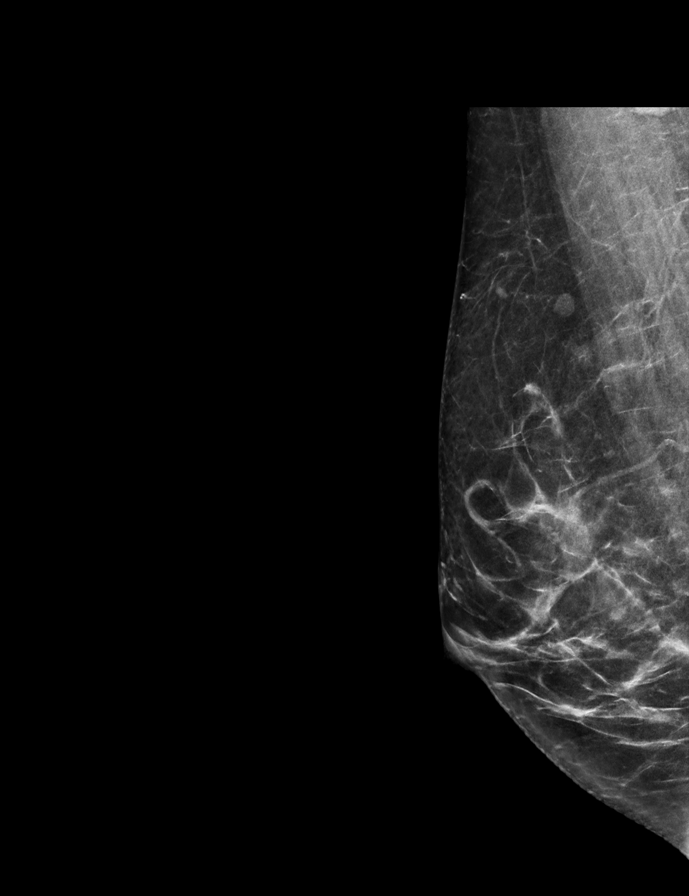

[R CC synth-2D]
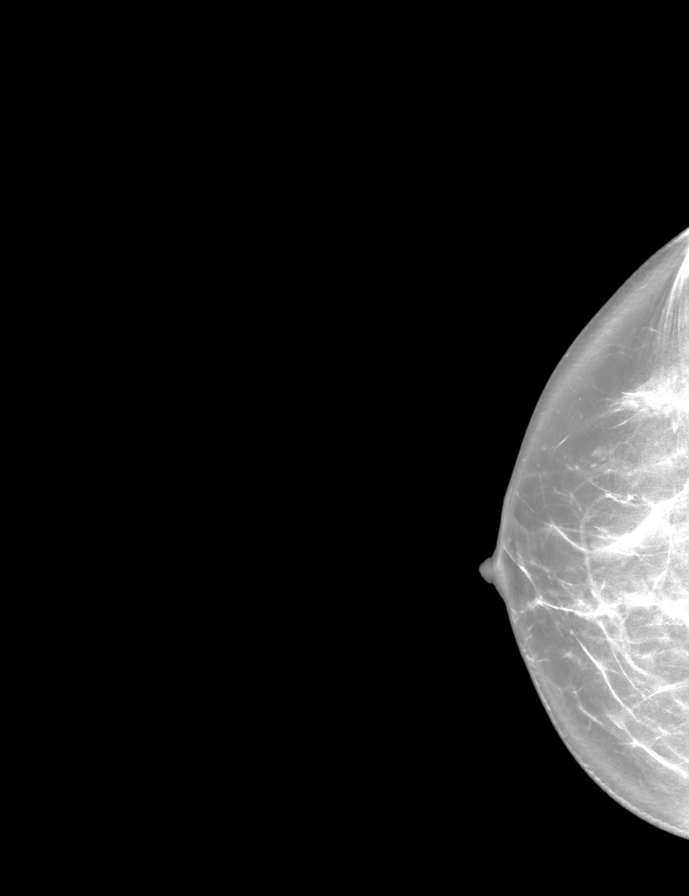

[L CC tomo · 2 of 59 frames shown]
[frame 20/59]
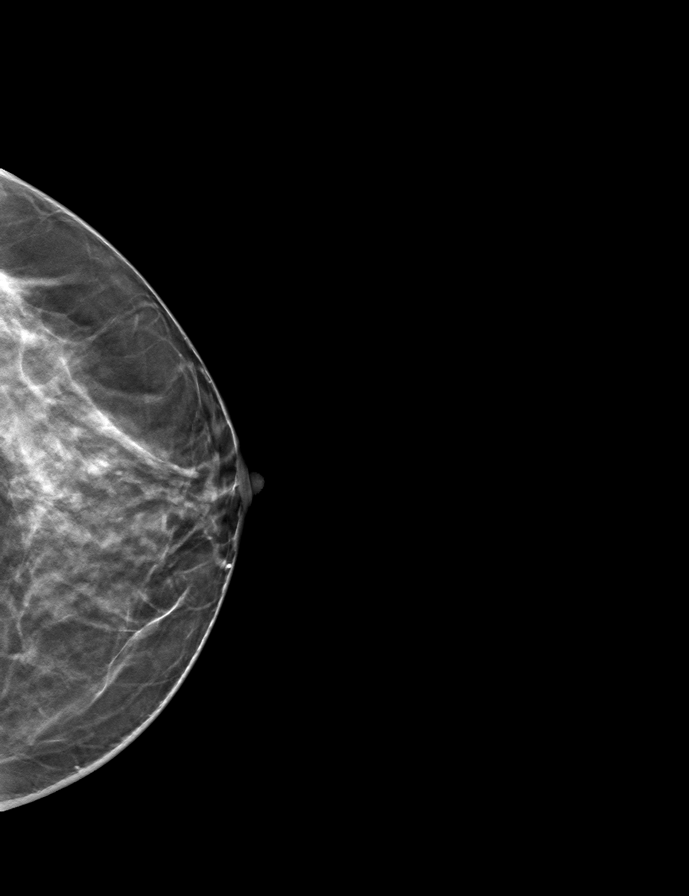
[frame 30/59]
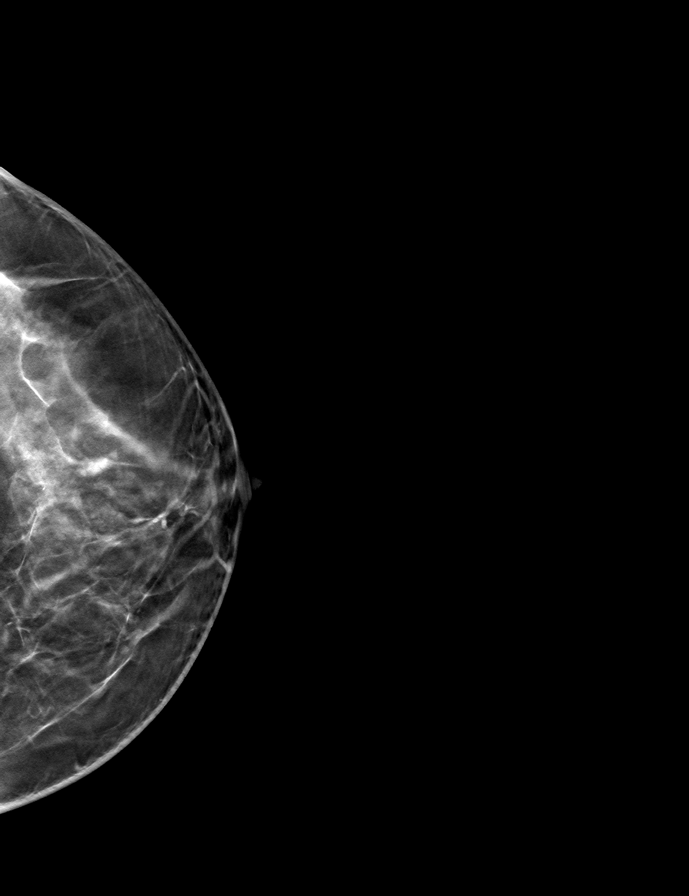

[R CC tomo · tomo slice 34/67.0]
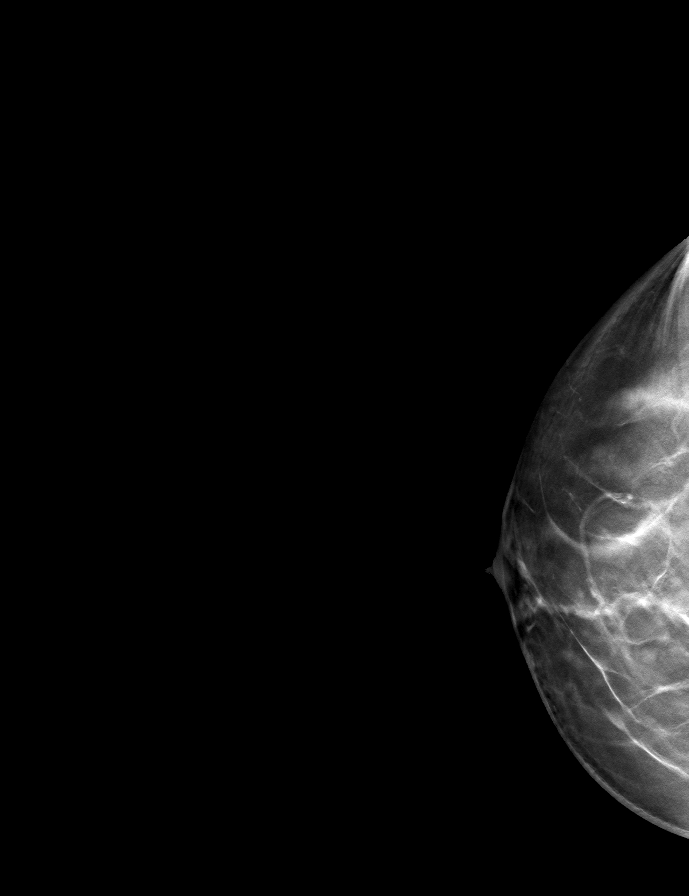

[R MLO tomo · tomo slice 32/63.0]
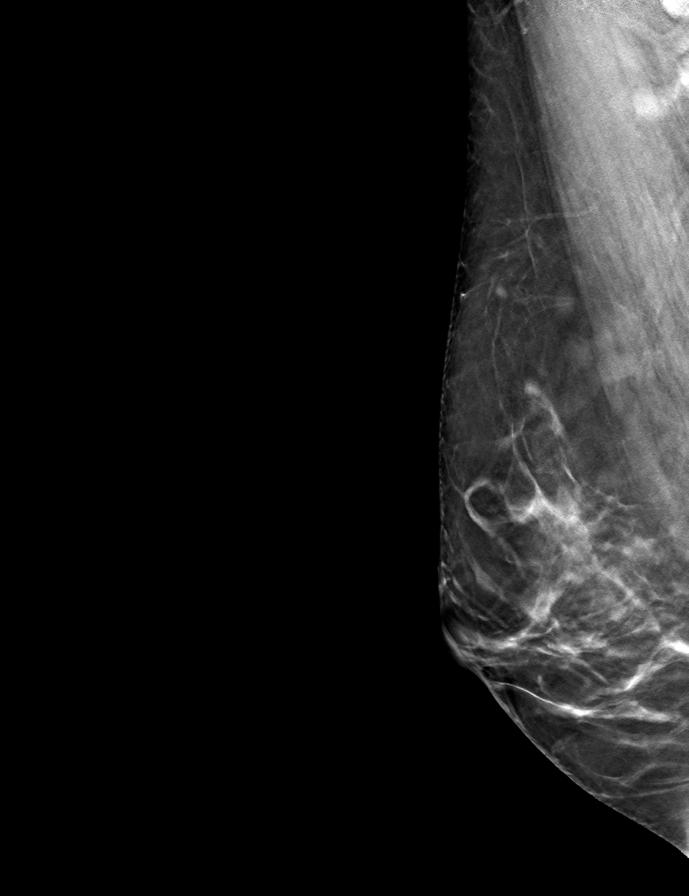

[L MLO tomo · tomo slice 31/62.0]
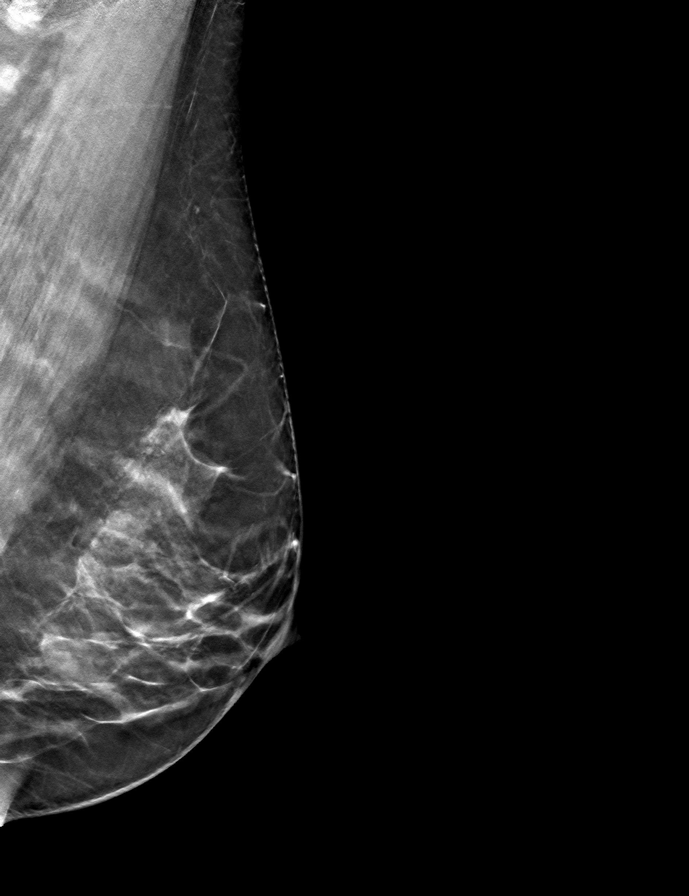

[9 of 24 positions shown; findings below may reference images not displayed]

ACR Breast Density Category c: The breast tissue is heterogeneously
dense, which may obscure small masses.
FINDINGS: There are no findings suspicious for malignancy.
IMPRESSION: No mammographic evidence of malignancy. A result letter of this
screening mammogram will be mailed directly to the patient.

RECOMMENDATION:
Screening mammogram in one year. (Code:Q3-W-BC3)

BI-RADS CATEGORY  1: Negative.

## 2022-09-15 DIAGNOSIS — Z7251 High risk heterosexual behavior: Secondary | ICD-10-CM | POA: Diagnosis not present

## 2022-09-15 DIAGNOSIS — Z113 Encounter for screening for infections with a predominantly sexual mode of transmission: Secondary | ICD-10-CM | POA: Diagnosis not present

## 2022-09-15 DIAGNOSIS — Z01419 Encounter for gynecological examination (general) (routine) without abnormal findings: Secondary | ICD-10-CM | POA: Diagnosis not present

## 2022-11-07 DIAGNOSIS — Z0000 Encounter for general adult medical examination without abnormal findings: Secondary | ICD-10-CM | POA: Diagnosis not present

## 2022-11-07 DIAGNOSIS — Z862 Personal history of diseases of the blood and blood-forming organs and certain disorders involving the immune mechanism: Secondary | ICD-10-CM | POA: Diagnosis not present

## 2023-03-29 ENCOUNTER — Other Ambulatory Visit: Payer: Self-pay | Admitting: Obstetrics and Gynecology

## 2023-03-29 DIAGNOSIS — Z1231 Encounter for screening mammogram for malignant neoplasm of breast: Secondary | ICD-10-CM

## 2023-05-23 ENCOUNTER — Ambulatory Visit: Payer: BC Managed Care – PPO

## 2023-06-06 ENCOUNTER — Ambulatory Visit
Admission: RE | Admit: 2023-06-06 | Discharge: 2023-06-06 | Disposition: A | Discharge status: Home / Self Care | Payer: BC Managed Care – PPO | Source: Ambulatory Visit | Attending: Obstetrics and Gynecology | Admitting: Obstetrics and Gynecology

## 2023-06-06 DIAGNOSIS — Z1231 Encounter for screening mammogram for malignant neoplasm of breast: Secondary | ICD-10-CM

## 2024-06-03 ENCOUNTER — Other Ambulatory Visit: Payer: Self-pay | Admitting: Nurse Practitioner

## 2024-06-03 DIAGNOSIS — Z1231 Encounter for screening mammogram for malignant neoplasm of breast: Secondary | ICD-10-CM

## 2024-06-11 ENCOUNTER — Ambulatory Visit
Admission: RE | Admit: 2024-06-11 | Discharge: 2024-06-11 | Disposition: A | Source: Ambulatory Visit | Attending: Nurse Practitioner | Admitting: Nurse Practitioner

## 2024-06-11 DIAGNOSIS — Z1231 Encounter for screening mammogram for malignant neoplasm of breast: Secondary | ICD-10-CM

## 2024-06-24 ENCOUNTER — Encounter: Payer: Self-pay | Admitting: Physician Assistant

## 2024-06-24 ENCOUNTER — Ambulatory Visit: Admitting: Physician Assistant

## 2024-06-24 VITALS — BP 107/69

## 2024-06-24 DIAGNOSIS — L209 Atopic dermatitis, unspecified: Secondary | ICD-10-CM

## 2024-06-24 DIAGNOSIS — D2270 Melanocytic nevi of unspecified lower limb, including hip: Secondary | ICD-10-CM

## 2024-06-24 DIAGNOSIS — D229 Melanocytic nevi, unspecified: Secondary | ICD-10-CM

## 2024-06-24 DIAGNOSIS — L219 Seborrheic dermatitis, unspecified: Secondary | ICD-10-CM

## 2024-06-24 DIAGNOSIS — D2261 Melanocytic nevi of right upper limb, including shoulder: Secondary | ICD-10-CM

## 2024-06-24 DIAGNOSIS — L732 Hidradenitis suppurativa: Secondary | ICD-10-CM | POA: Diagnosis not present

## 2024-06-24 MED ORDER — CLOBETASOL PROPIONATE 0.05 % EX OINT: 1.0000 | TOPICAL_OINTMENT | Freq: Two times a day (BID) | CUTANEOUS | 0 refills | Status: AC

## 2024-06-24 MED ORDER — TRIAMCINOLONE ACETONIDE 0.1 % EX OINT: 1.0000 | TOPICAL_OINTMENT | Freq: Every day | CUTANEOUS | 0 refills | Status: AC | PRN

## 2024-06-24 MED ORDER — CLINDAMYCIN PHOSPHATE 1 % EX LOTN: TOPICAL_LOTION | Freq: Every day | CUTANEOUS | 5 refills | Status: AC

## 2024-06-24 NOTE — Patient Instructions (Addendum)

## 2024-06-24 NOTE — Progress Notes (Signed)
 New Patient Visit   Subjective  Kristin Payne is a 43 y.o. female NEW PATIENT who presents for the following: She has a history of recurring boils of underarms, groin area and waistline. She does not have any currently today. She had LHR a few years ago and that helped but now the hair is starting to grow back and she is worried the boils will restart.  Other concerns: She has a mole on her right arm and her left foot that she would like checked.  She has dry skin inside her ears that comes out in clumps. She is using an OTC drop and cortisone but they burn because she scratches so much.  Also needs refill on her eczema creams.     The following portions of the chart were reviewed this encounter and updated as appropriate: medications, allergies, medical history  Review of Systems:  No other skin or systemic complaints except as noted in HPI or Assessment and Plan.  Objective  Well appearing patient in no apparent distress; mood and affect are within normal limits.  A focused examination was performed of the following areas: FULL SKIN EXAM    Relevant exam findings are noted in the Assessment and Plan.    Assessment & Plan   HIDRADENITIS SUPPURATIVA -  Exam: Scarring with no active lesions today   Hidradenitis Suppurativa is a chronic; persistent; non-curable, but treatable condition due to abnormal inflamed sweat glands in the body folds (axilla, inframammary, groin, medial thighs), causing recurrent painful draining cysts and scarring. It can be associated with severe scarring acne and cysts; also abscesses and scarring of scalp. The goal is control and prevention of flares, as it is not curable. Scars are permanent and can be thickened. Treatment may include daily use of topical medication and oral antibiotics.  Oral isotretinoin may also be helpful.  For some cases, Humira or Cosentyx (biologic injections) may be prescribed to decrease the inflammatory process and prevent  flares.  When indicated, inflamed cysts may also be treated surgically.  Treatment Plan: Recommend she alternate Dial antibacterial soap, Panoxyl wash and hibiclens to wash. Do not recommend wash the rest of her body only the affected areas.  Clindamycin lotion Apply to affected areas daily.   ATOPIC DERMATITIS - SCATTERED - abdomen  Exam: erythematous scaly patches  Treatment Plan: Continue Clobetasol ointment twice daily until clear  Continue TMC 0.1% ointment twice daily to less severe areas of eczema until clear.   SEBORRHEIC DERMATITIS Exam: Pink patches with greasy scale at ears  Seborrheic Dermatitis is a chronic persistent rash characterized by pinkness and scaling most commonly of the mid face but also can occur on the scalp (dandruff), ears; mid chest, mid back and groin.  It tends to be exacerbated by stress and cooler weather.  People who have neurologic disease may experience new onset or exacerbation of existing seborrheic dermatitis.  The condition is not curable but treatable and can be controlled.  Treatment Plan: Dermotic oil daily 3-5 days per week as needed - sample given today. Advised patient we will send to [facility] if she needs a refill.   BENIGN APPEARING NEVUS  - right upper arm / foot   HIDRADENITIS SUPPURATIVA   MULTIPLE BENIGN NEVI   SEBORRHEIC DERMATITIS   ATOPIC DERMATITIS, UNSPECIFIED TYPE    Return in 6 months (on 12/22/2024) for HS, etc .  I, Hollie Dean, CMA, am acting as scribe for [employer], PA-C .   Documentation: I  have reviewed the above documentation for accuracy and completeness, and I agree with the above.  Nazirah Tri K, PA-C

## 2024-12-02 ENCOUNTER — Ambulatory Visit: Admitting: Dermatology

## 2024-12-02 ENCOUNTER — Ambulatory Visit: Admitting: Physician Assistant
